# Patient Record
Sex: Female | Born: 1970 | Race: Black or African American | Hispanic: No | Marital: Married | Smoking: Never smoker
Health system: Southern US, Community
[De-identification: ages and names within clinical notes are randomized; demographics above are authoritative.]

## PROBLEM LIST (undated history)

## (undated) DIAGNOSIS — T7840XA Allergy, unspecified, initial encounter: Secondary | ICD-10-CM

## (undated) DIAGNOSIS — R011 Cardiac murmur, unspecified: Secondary | ICD-10-CM

## (undated) DIAGNOSIS — F32A Depression, unspecified: Secondary | ICD-10-CM

## (undated) DIAGNOSIS — I10 Essential (primary) hypertension: Secondary | ICD-10-CM

## (undated) DIAGNOSIS — R002 Palpitations: Secondary | ICD-10-CM

## (undated) DIAGNOSIS — E119 Type 2 diabetes mellitus without complications: Secondary | ICD-10-CM

## (undated) DIAGNOSIS — F419 Anxiety disorder, unspecified: Secondary | ICD-10-CM

## (undated) HISTORY — DX: Cardiac murmur, unspecified: R01.1

## (undated) HISTORY — DX: Anxiety disorder, unspecified: F41.9

## (undated) HISTORY — DX: Depression, unspecified: F32.A

## (undated) HISTORY — PX: TONSILLECTOMY: SUR1361

## (undated) HISTORY — DX: Palpitations: R00.2

## (undated) HISTORY — DX: Allergy, unspecified, initial encounter: T78.40XA

---

## 2000-02-16 ENCOUNTER — Emergency Department (HOSPITAL_COMMUNITY): Admission: EM | Admit: 2000-02-16 | Discharge: 2000-02-16 | Payer: Self-pay | Admitting: Emergency Medicine

## 2000-02-16 ENCOUNTER — Encounter: Payer: Self-pay | Admitting: Emergency Medicine

## 2003-02-17 ENCOUNTER — Emergency Department (HOSPITAL_COMMUNITY): Admission: EM | Admit: 2003-02-17 | Discharge: 2003-02-17 | Payer: Self-pay | Admitting: Emergency Medicine

## 2003-02-20 ENCOUNTER — Encounter: Admission: RE | Admit: 2003-02-20 | Discharge: 2003-02-20 | Payer: Self-pay | Admitting: Obstetrics and Gynecology

## 2003-02-22 ENCOUNTER — Inpatient Hospital Stay (HOSPITAL_COMMUNITY): Admission: AD | Admit: 2003-02-22 | Discharge: 2003-02-22 | Payer: Self-pay | Admitting: Obstetrics and Gynecology

## 2003-03-15 ENCOUNTER — Inpatient Hospital Stay (HOSPITAL_COMMUNITY): Admission: AD | Admit: 2003-03-15 | Discharge: 2003-03-17 | Payer: Self-pay | Admitting: Obstetrics and Gynecology

## 2003-03-18 ENCOUNTER — Inpatient Hospital Stay (HOSPITAL_COMMUNITY): Admission: AD | Admit: 2003-03-18 | Discharge: 2003-04-01 | Payer: Self-pay | Admitting: Obstetrics and Gynecology

## 2003-03-28 ENCOUNTER — Encounter (INDEPENDENT_AMBULATORY_CARE_PROVIDER_SITE_OTHER): Payer: Self-pay | Admitting: Specialist

## 2003-04-02 ENCOUNTER — Encounter: Admission: RE | Admit: 2003-04-02 | Discharge: 2003-05-02 | Payer: Self-pay | Admitting: Obstetrics and Gynecology

## 2003-05-03 ENCOUNTER — Encounter: Admission: RE | Admit: 2003-05-03 | Discharge: 2003-06-02 | Payer: Self-pay | Admitting: Obstetrics and Gynecology

## 2003-05-08 ENCOUNTER — Other Ambulatory Visit: Admission: RE | Admit: 2003-05-08 | Discharge: 2003-05-08 | Payer: Self-pay | Admitting: Obstetrics and Gynecology

## 2003-06-20 ENCOUNTER — Encounter: Admission: RE | Admit: 2003-06-20 | Discharge: 2003-07-20 | Payer: Self-pay | Admitting: Obstetrics and Gynecology

## 2003-07-31 ENCOUNTER — Encounter: Admission: RE | Admit: 2003-07-31 | Discharge: 2003-08-30 | Payer: Self-pay | Admitting: Obstetrics and Gynecology

## 2003-08-31 ENCOUNTER — Encounter: Admission: RE | Admit: 2003-08-31 | Discharge: 2003-09-30 | Payer: Self-pay | Admitting: Obstetrics and Gynecology

## 2004-10-18 IMAGING — US US FETAL BPP W/O NONSTRESS
1 series · 18 of 28 positions shown · non-contrast
Comparison: none

CLINICAL DATA: Chronic hypertension.  Assess fetal well-being and obstetrical doppler.  G1 P0.  LMP 08/05/02.

[Series 1: us ob detail +14 wk · 18 of 135 slices shown]
[im 1/135]
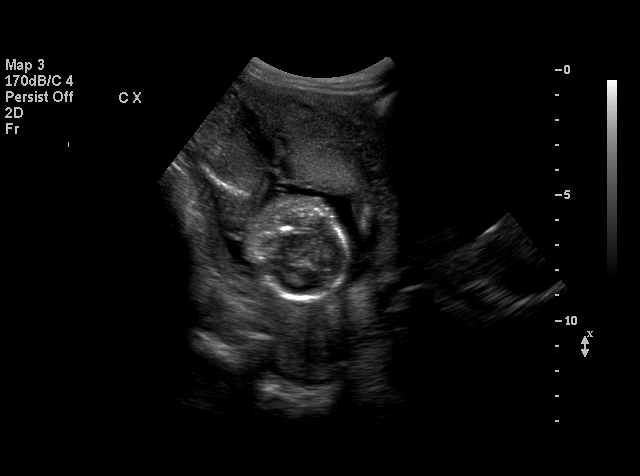
[im 10/135]
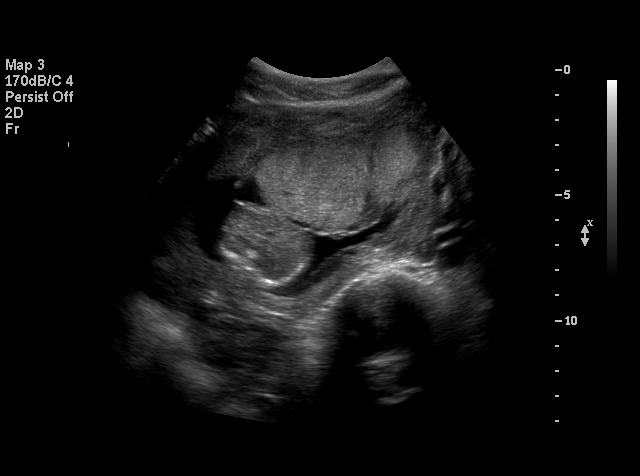
[im 15/135]
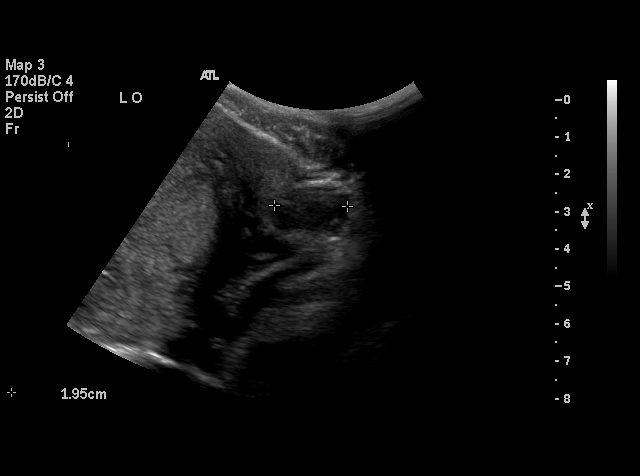
[im 25/135]
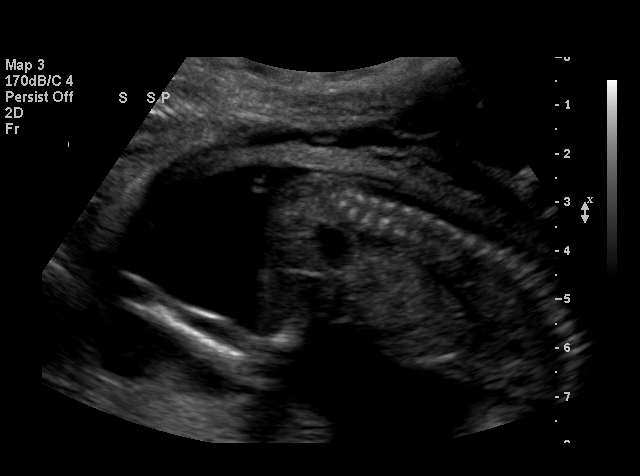
[im 35/135]
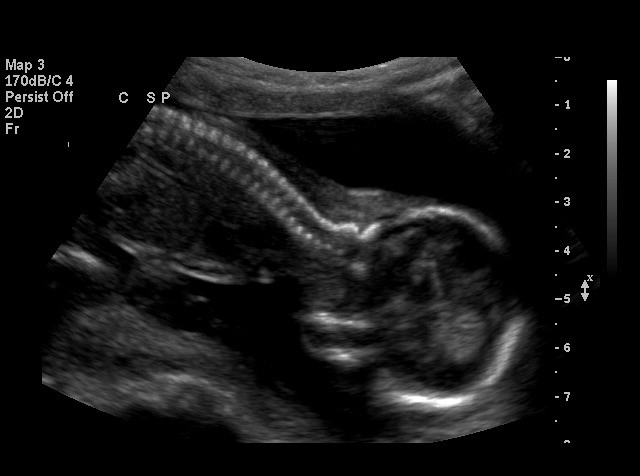
[im 40/135]
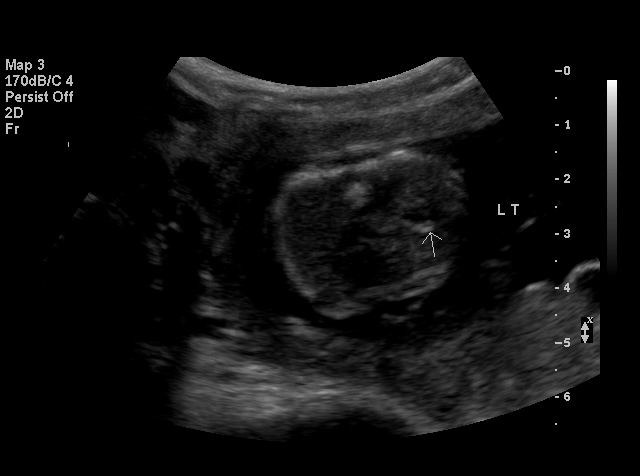
[im 50/135]
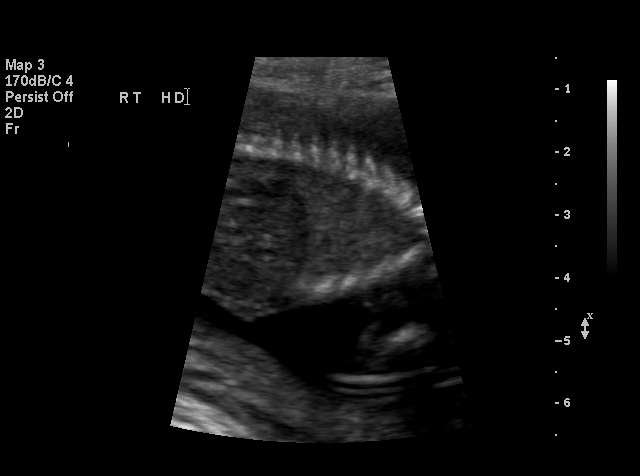
[im 55/135]
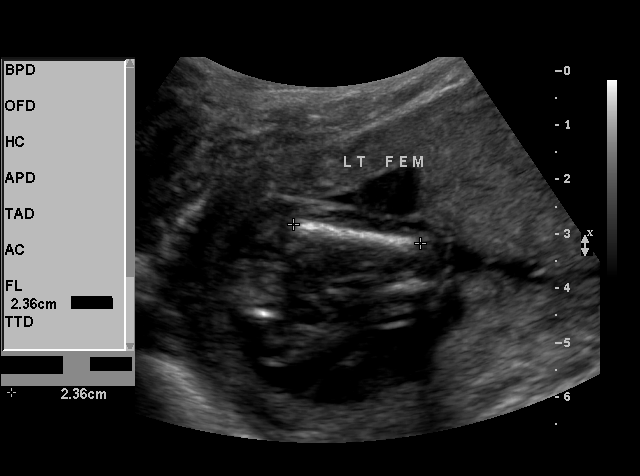
[im 65/135]
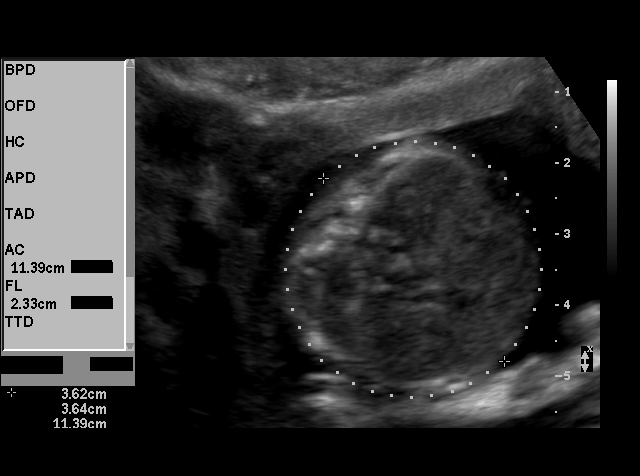
[im 70/135]
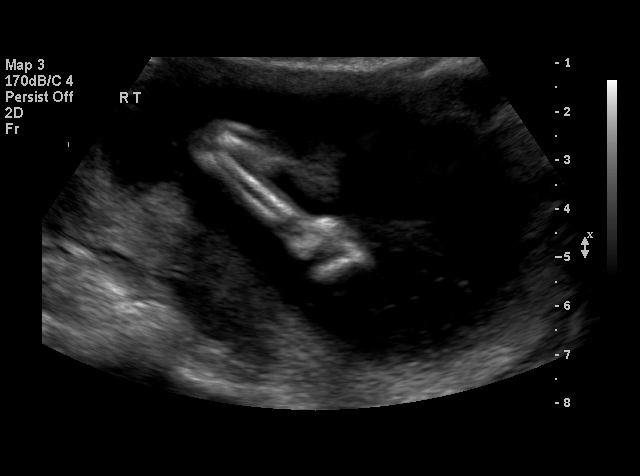
[im 80/135]
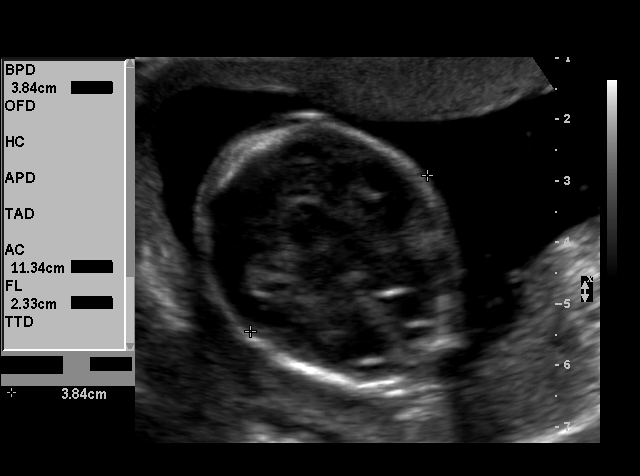
[im 85/135]
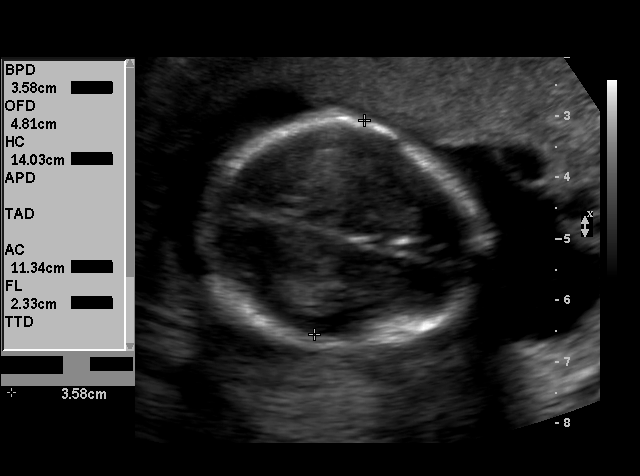
[im 95/135]
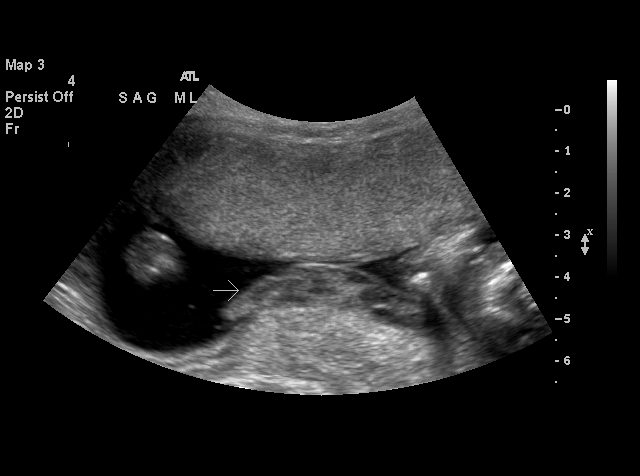
[im 105/135]
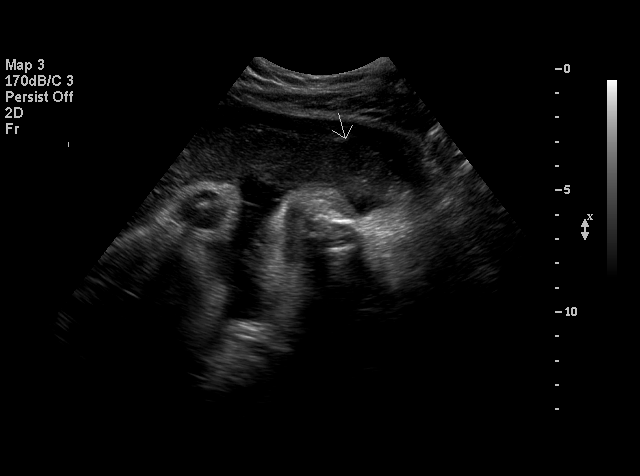
[im 110/135]
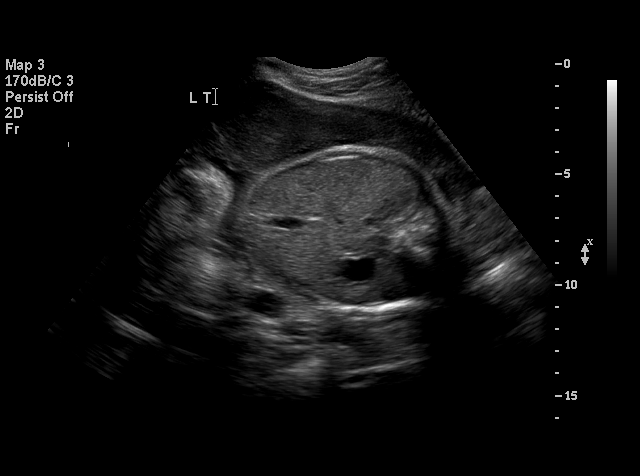
[im 120/135]
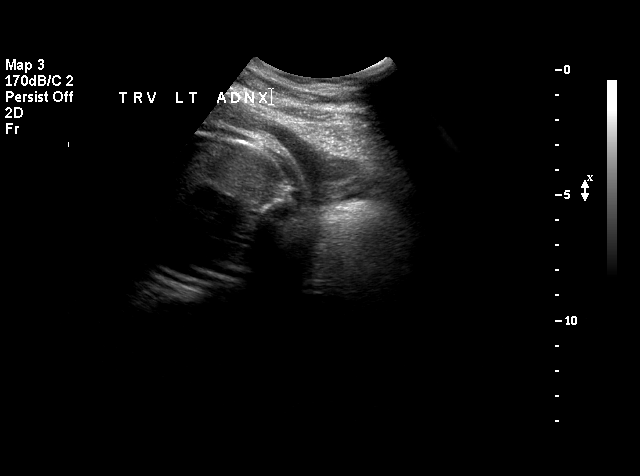
[im 125/135]
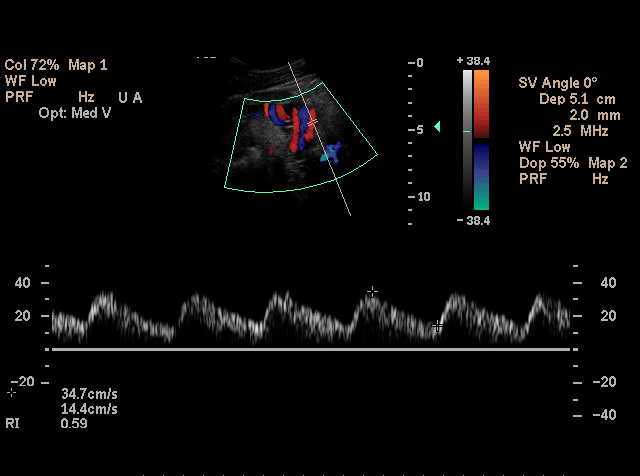
[im 135/135]
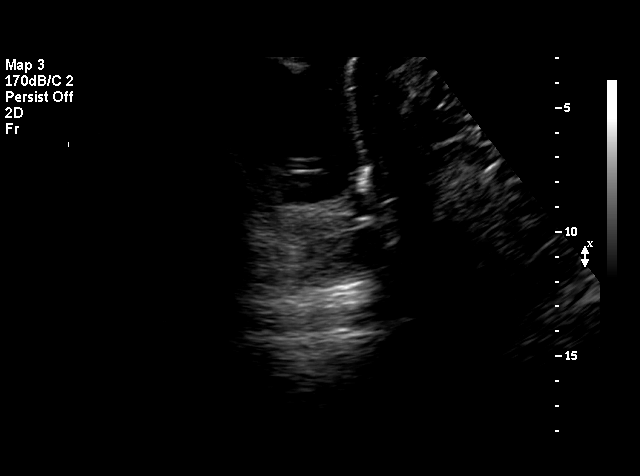

[18 of 28 positions shown; findings below may reference images not displayed]

BIOPHYSICAL PROFILE
NUMBER OF FETUSES:  1
HEART RATE:  150
MOVEMENT:  Yes
BREATHING:  Yes
PRESENTATION:  Cephalic
PLACENTAL LOCATION:  Anterior
GRADE:  I
PREVIA:  No
AMNIOTIC FLUID (SUBJECTIVE):  Decreased
AMNIOTIC FLUID (OBJECTIVE):  5.0 cm AFI (5th - 95th%ile =   8.3 ? 24.5 cm for 33 wks)

Fetal measurements and complete anatomic evaluation were not requested on this exam.  The following fetal anatomy was visualized today:  Lateral ventricles, four chamber heart, stomach, 3 vessel cord, kidneys, bladder, and diaphragm

BPP SCORING
MOVEMENTS:  2  Time:  15 minutes
BREATHING:  2
TONE:  2
AMNIOTIC FLUID:  2
TOTAL SCORE:  8

MATERNAL FINDINGS:
CERVIX:  3.1 cm Translabially

IMPRESSION
Single living intrauterine fetus in cephalic presentation.  Decreased amniotic fluid volume with AFI of 5 cm.
Biophysical profile score [DATE].
DOPPLER ULTRASOUND OF FETUS
Umbilical Artery S/D Ratio:  2.33  (NL < 3.70)

Middle Cerebral Artery PI:  1.48   (NL > 1.40)
IMPRESSION: Normal obstetrical doppler.

## 2004-10-20 IMAGING — US US FETAL BPP W/O NONSTRESS
1 series · 14 of 26 positions shown · non-contrast
Comparison: none

CLINICAL DATA: 32 week 6 day assigned gestational age.  Chronic hypertension. Evaluate biophysical profile and fetal dopplers.

[Series 1: unknown · 0.30mm/px · 14 of 26 slices shown]
[im 1/26]
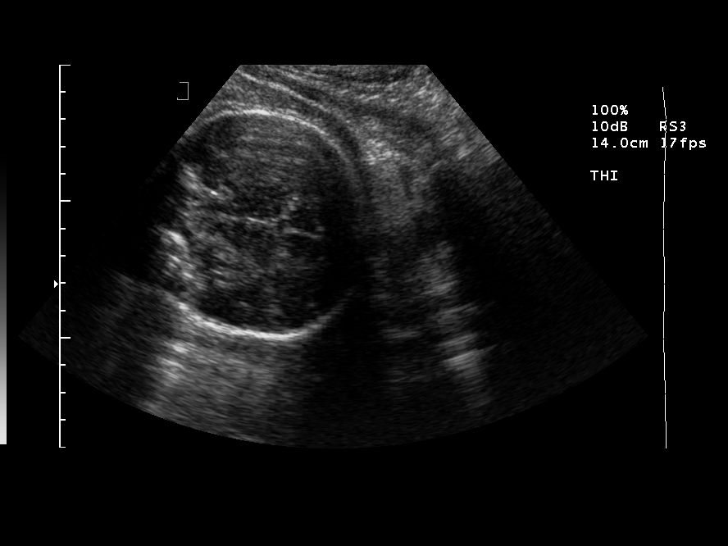
[im 3/26]
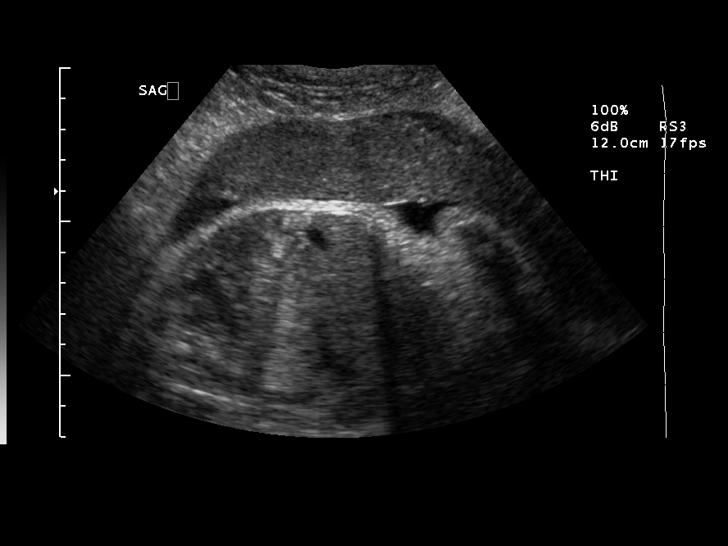
[im 5/26]
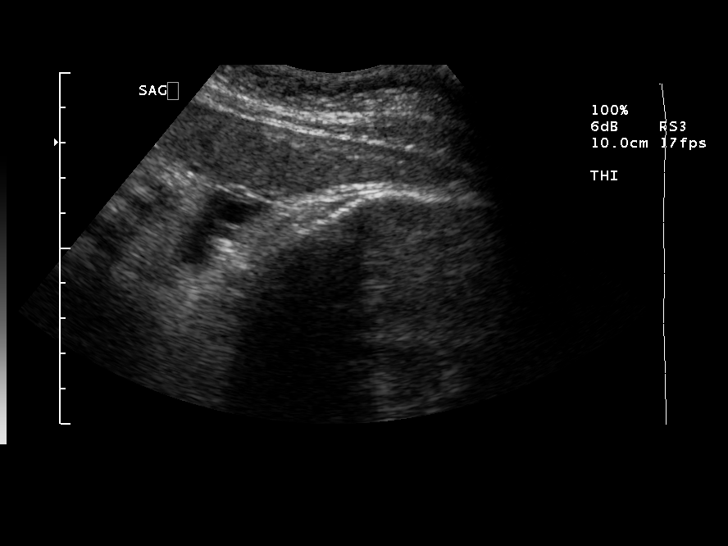
[im 7/26]
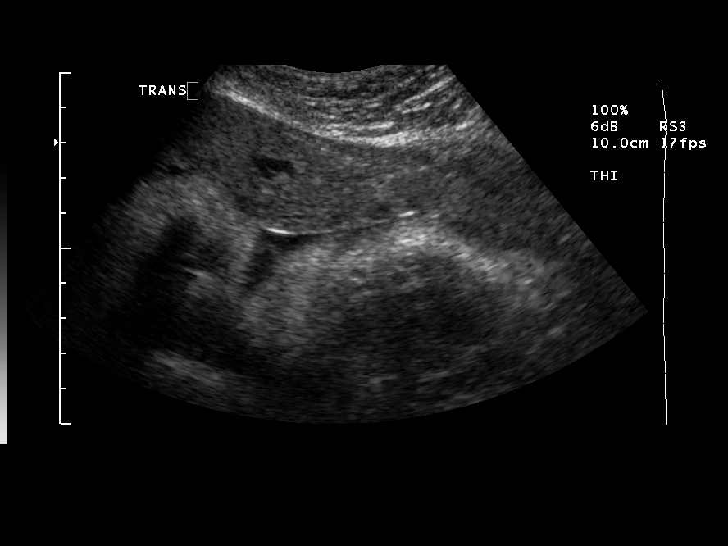
[im 9/26]
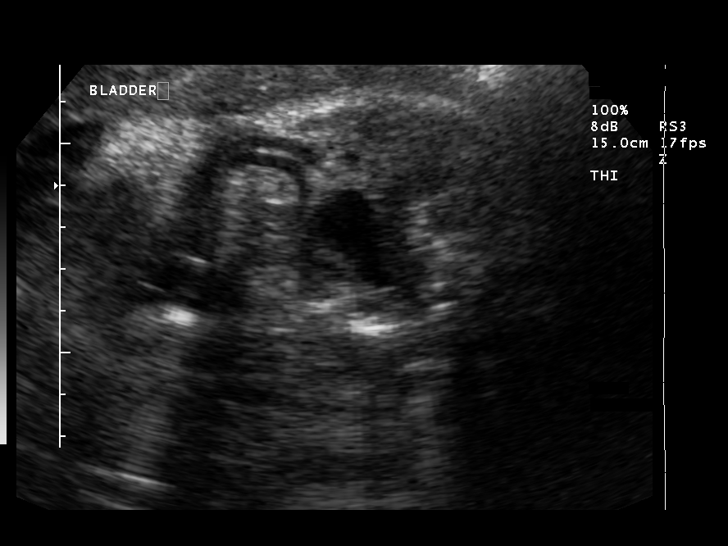
[im 11/26]
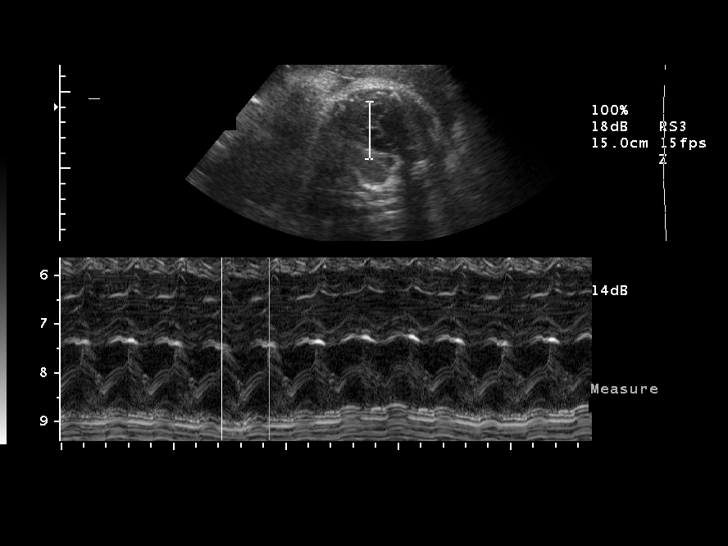
[im 13/26]
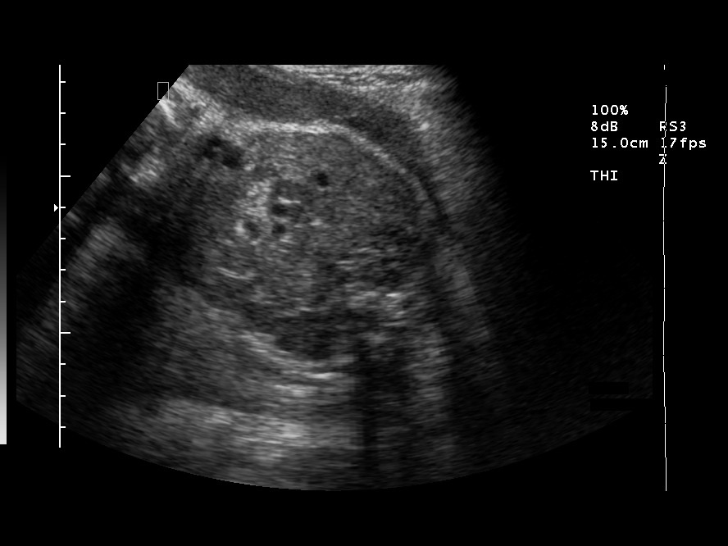
[im 14/26]
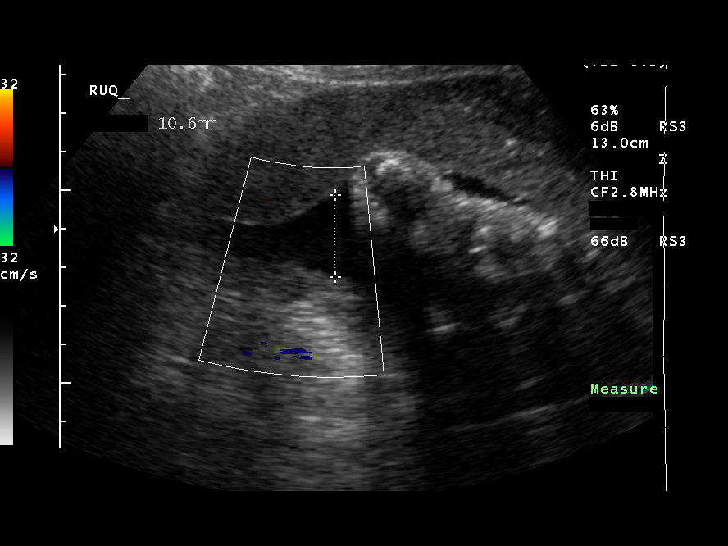
[im 16/26]
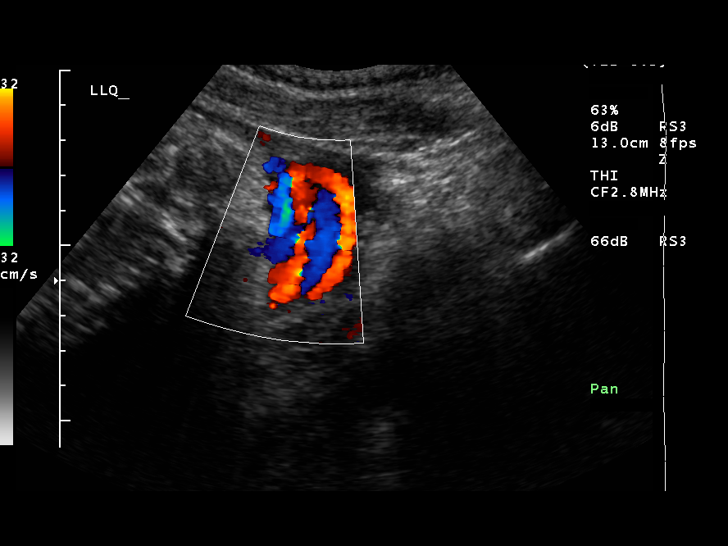
[im 18/26]
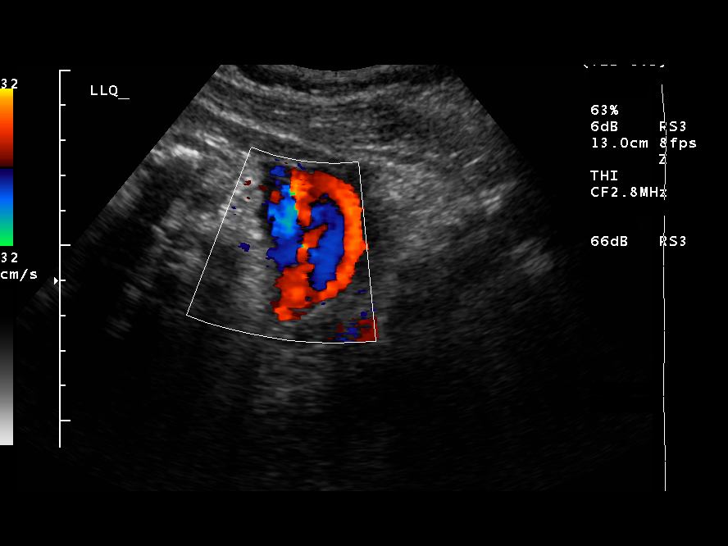
[im 20/26]
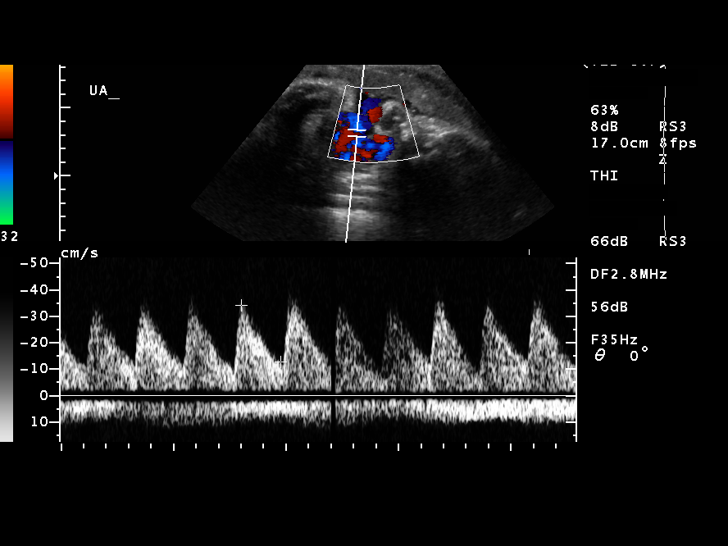
[im 22/26]
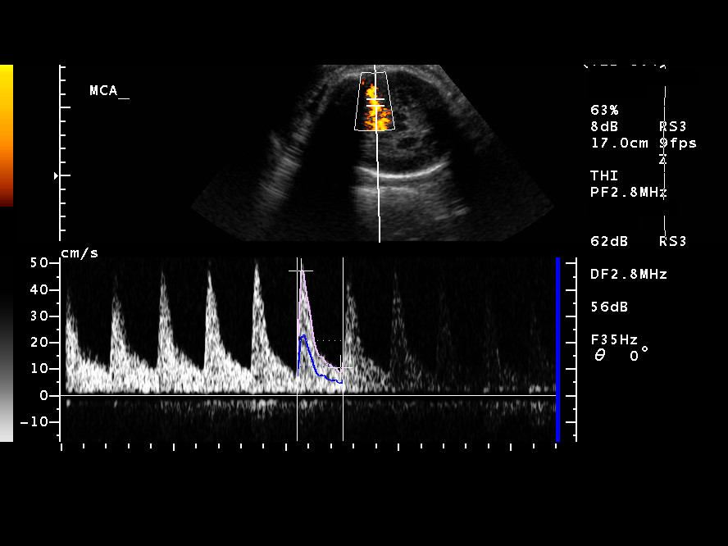
[im 24/26]
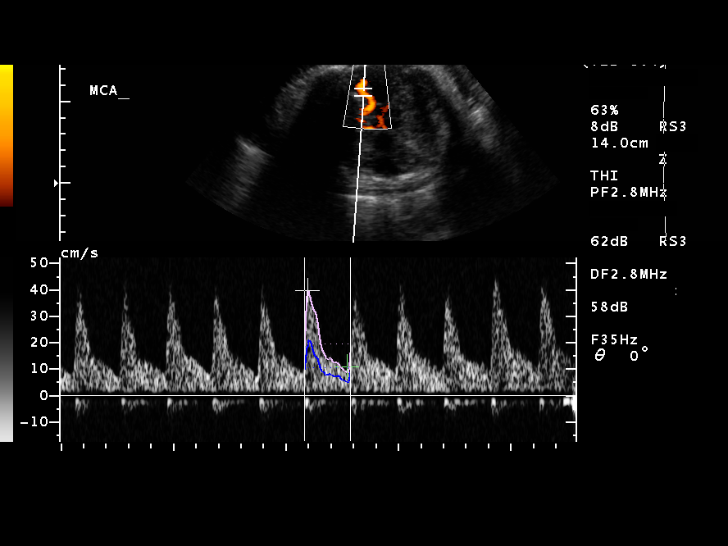
[im 26/26]
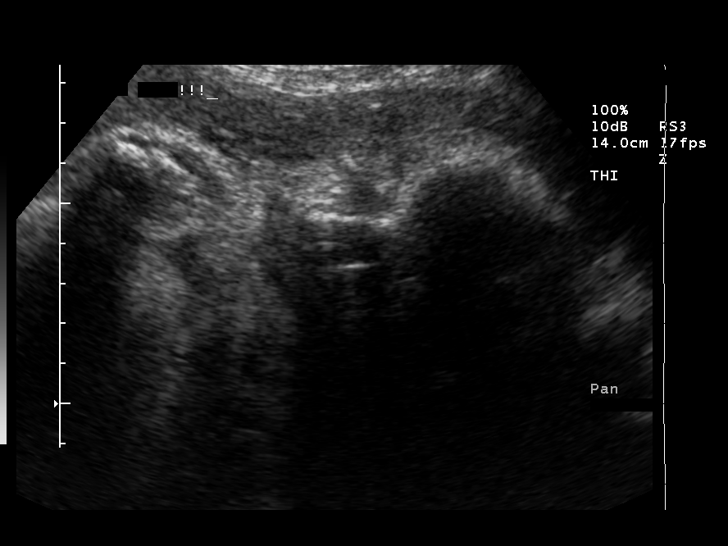

[14 of 26 positions shown; findings below may reference images not displayed]

BIOPHYSICAL PROFILE
NUMBER OF FETUSES:  1
HEART RATE:  142
MOVEMENT:  Yes
BREATHING:  Yes
PRESENTATION:  Cephalic
PLACENTAL LOCATION:  Anterior
GRADE:  II
PREVIA:  No
AMNIOTIC FLUID (SUBJECTIVE):  Decreased
AMNIOTIC FLUID (OBJECTIVE):  7.1 cm AFI (5th -95th%ile =   8.3 ? 24.5 cm for 33 wks)

Fetal measurements and complete anatomic evaluation were not requested on this exam.  The following fetal anatomy was visualized today:  Lateral ventricles, stomach, kidneys, bladder and diaphragm.

BPP SCORING
MOVEMENTS:  2  Time:  15 minutes
BREATHING:  2
TONE:  2
AMNIOTIC FLUID:  2
TOTAL SCORE:  8

MATERNAL FINDINGS:
CERVIX:  Not evaluated; 3.1 cm on 03/21/03.

IMPRESSION
Single living intrauterine fetus in cephalic presentation.  Amniotic fluid volume remains subjectively decreased, with AFI of 7.1 cm.
Biophysical profile score [DATE].

DOPPLER ULTRASOUND OF FETUS
Umbilical Artery S/D Ratio:  2.60  (NL <3.70)

Middle Cerebral Artery PI:  1.60   (NL > 1.40)
IMPRESSION: Normal fetal doppler measurements.

## 2004-10-23 IMAGING — US US UA DOPPLER RE-EVAL
1 series · 14 of 28 positions shown · non-contrast
Comparison: none

CLINICAL DATA: Preeclampsia.  Assess fetal well-being.

[Series 1: unknown · 0.30mm/px · 14 of 30 slices shown]
[im 2/30]
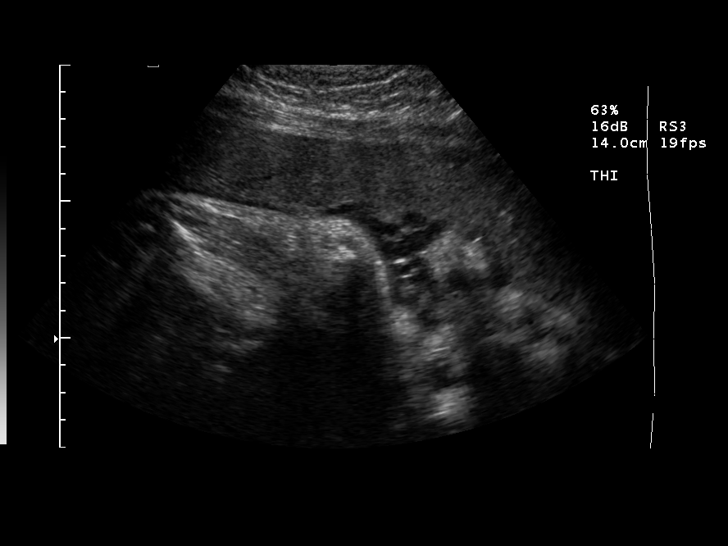
[im 4/30]
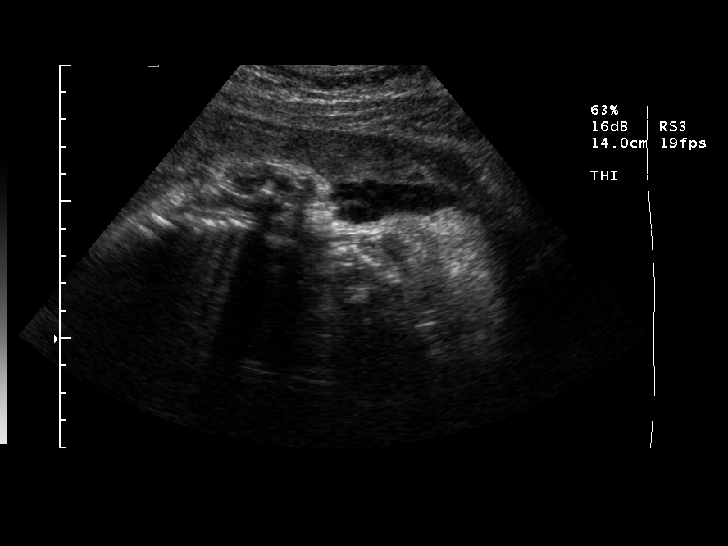
[im 6/30]
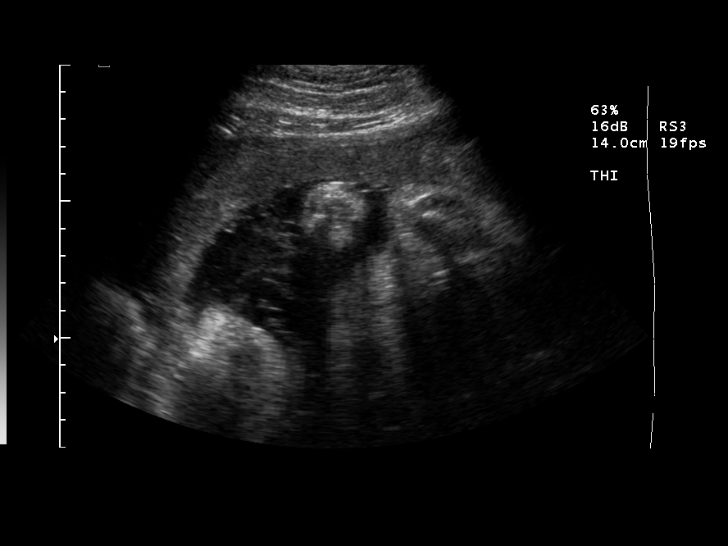
[im 8/30]
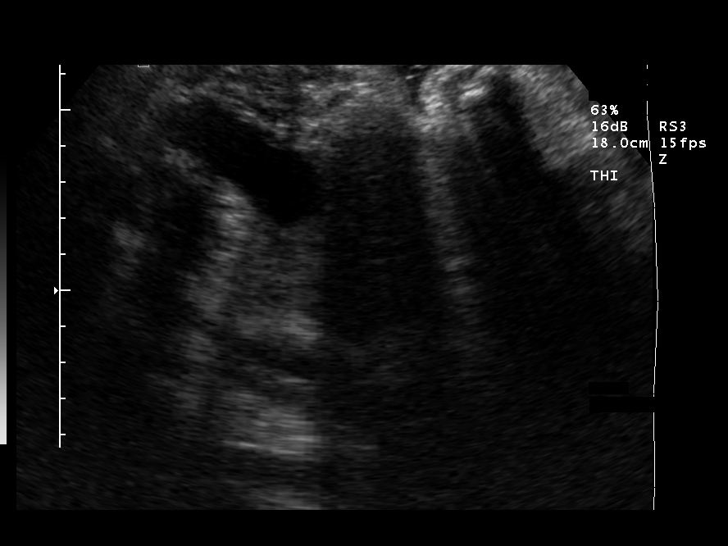
[im 10/30]
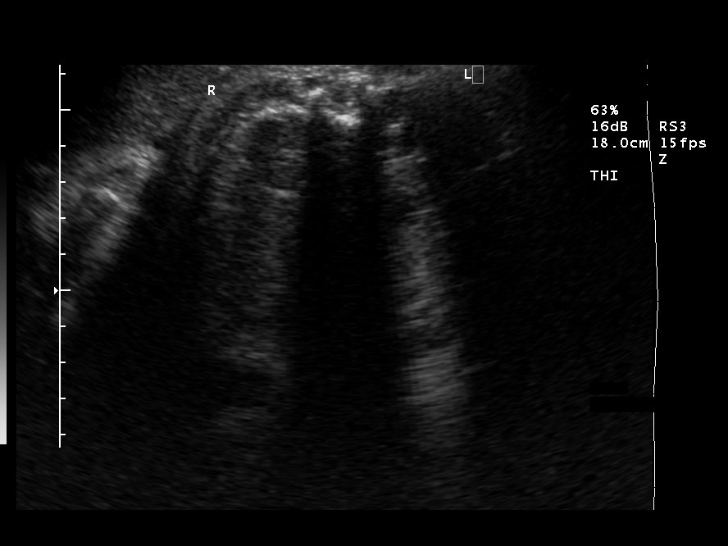
[im 12/30]
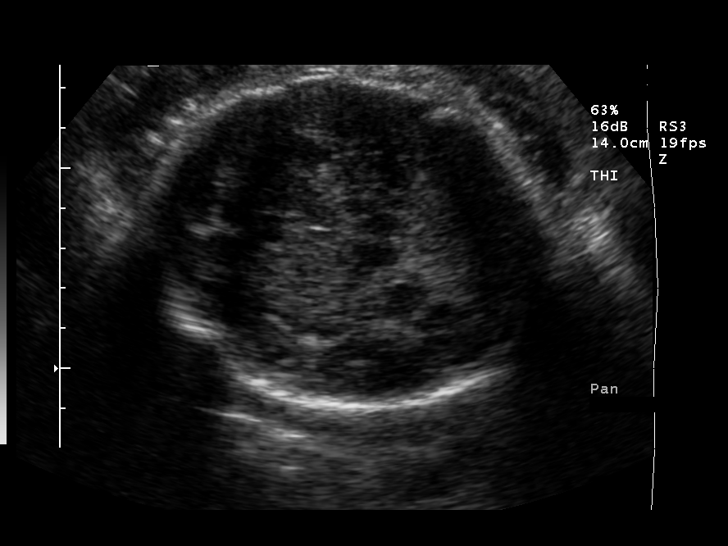
[im 14/30]
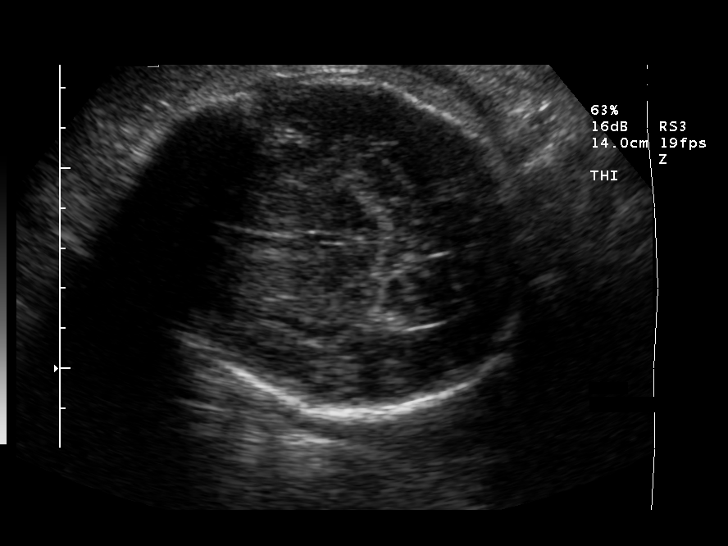
[im 17/30]
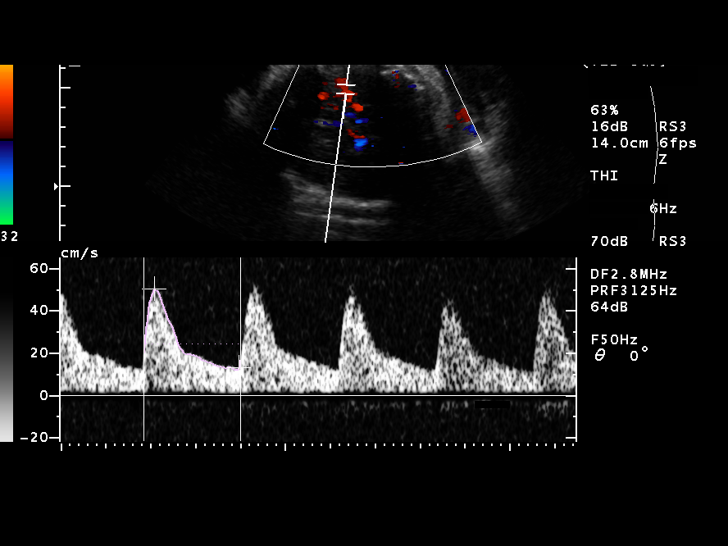
[im 19/30]
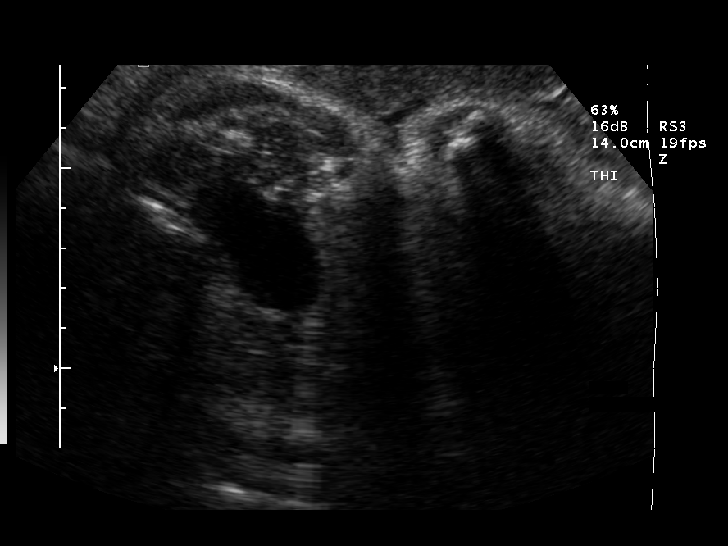
[im 21/30]
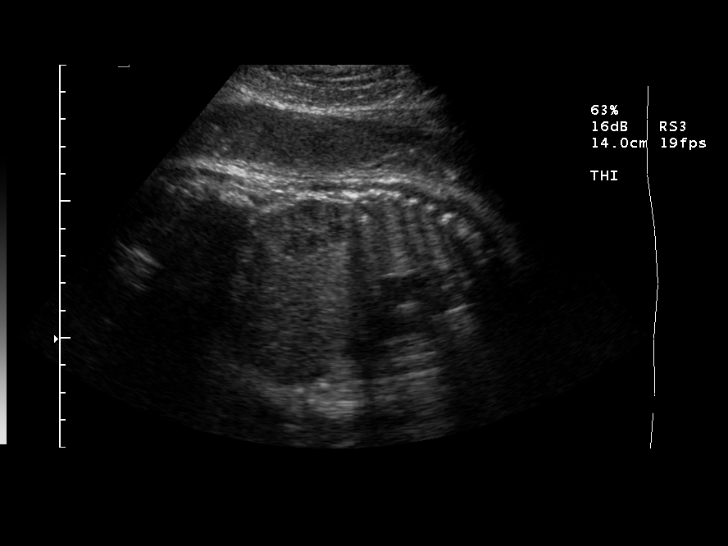
[im 23/30]
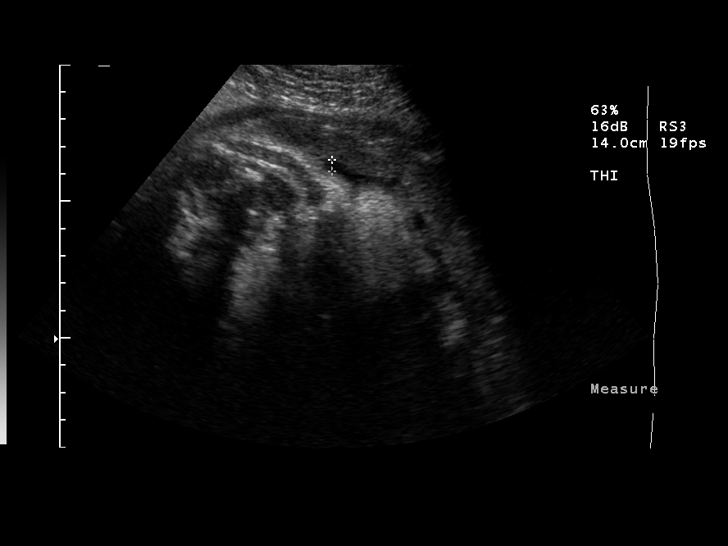
[im 25/30]
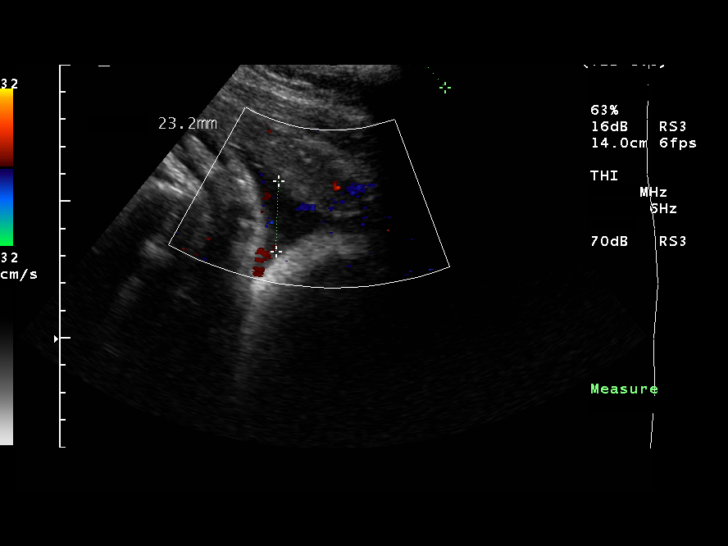
[im 27/30]
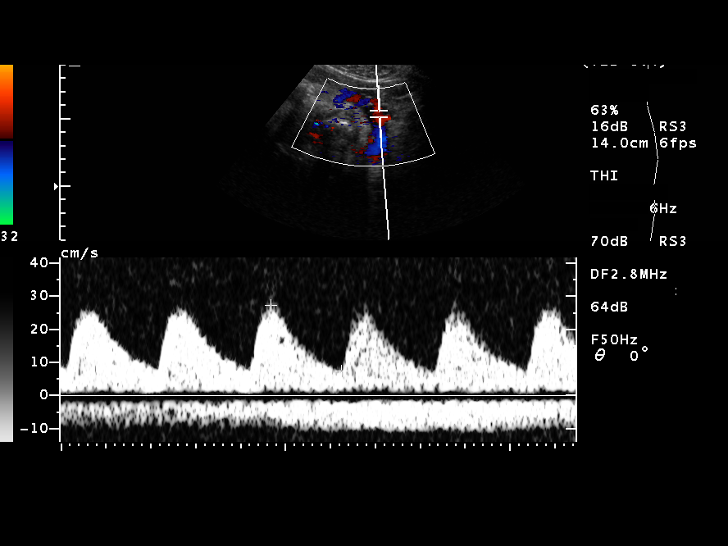
[im 30/30]
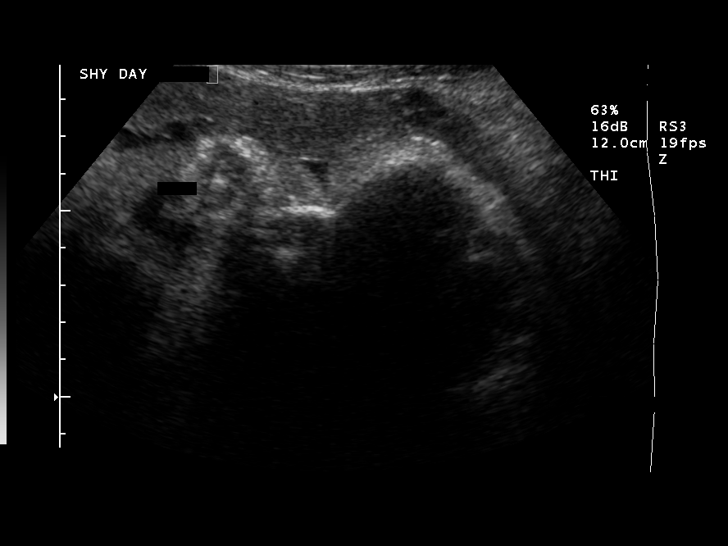

[14 of 28 positions shown; findings below may reference images not displayed]

LIMITED OBSTETRICAL ULTRASOUND
Number of Fetuses: 1
Heart Rate:  140
Movement:  Yes
Breathing:  Yes
Presentation:  Cephalic
Placental Location:  Anterior
Grade:  II
Previa:  No
Amniotic Fluid (Subjective):  Low normal  
Amniotic Fluid (Objective):  9.0 cm AFI (5th -95th%ile =   8.3 ? 24.5 cm for 33 wks)

Fetal measurements and complete anatomic evaluation were not requested.  The following fetal anatomy was visualized during this exam:  Lateral ventricles, thalami, stomach, kidneys, bladder and diaphragm

MATERNAL FINDINGS:
Cervix:  Not evaluated

DOPPLER ULTRASOUND OF FETUS
Umbilical Artery S/D Ratio:  3.77  (NL < 3.70)

Middle Cerebral Artery PI:  1.56  (NL > 1.40)
IMPRESSION: Slightly elevated umbilical artery doppler with a normal middle cerebral artery doppler assessment.  No absence or reversal of end diastolic flow is apparent.
Subjectively and quantitatively low normal amniotic fluid volume.

## 2005-01-12 ENCOUNTER — Other Ambulatory Visit: Admission: RE | Admit: 2005-01-12 | Discharge: 2005-01-12 | Payer: Self-pay | Admitting: Obstetrics and Gynecology

## 2005-01-22 ENCOUNTER — Emergency Department (HOSPITAL_COMMUNITY): Admission: EM | Admit: 2005-01-22 | Discharge: 2005-01-22 | Payer: Self-pay | Admitting: Emergency Medicine

## 2005-05-18 ENCOUNTER — Emergency Department (HOSPITAL_COMMUNITY): Admission: EM | Admit: 2005-05-18 | Discharge: 2005-05-18 | Payer: Self-pay | Admitting: Family Medicine

## 2005-08-04 ENCOUNTER — Encounter: Admission: RE | Admit: 2005-08-04 | Discharge: 2005-08-04 | Payer: Self-pay | Admitting: Obstetrics and Gynecology

## 2005-11-03 ENCOUNTER — Inpatient Hospital Stay (HOSPITAL_COMMUNITY): Admission: AD | Admit: 2005-11-03 | Discharge: 2005-11-03 | Payer: Self-pay | Admitting: Obstetrics and Gynecology

## 2005-11-20 ENCOUNTER — Ambulatory Visit: Payer: Self-pay | Admitting: Gynecology

## 2005-11-20 ENCOUNTER — Inpatient Hospital Stay (HOSPITAL_COMMUNITY): Admission: AD | Admit: 2005-11-20 | Discharge: 2005-12-07 | Payer: Self-pay | Admitting: Obstetrics and Gynecology

## 2005-12-03 ENCOUNTER — Encounter (INDEPENDENT_AMBULATORY_CARE_PROVIDER_SITE_OTHER): Payer: Self-pay | Admitting: Specialist

## 2005-12-03 ENCOUNTER — Ambulatory Visit: Payer: Self-pay | Admitting: Neonatology

## 2005-12-08 ENCOUNTER — Encounter: Admission: RE | Admit: 2005-12-08 | Discharge: 2006-01-06 | Payer: Self-pay | Admitting: Obstetrics and Gynecology

## 2006-01-07 ENCOUNTER — Encounter: Admission: RE | Admit: 2006-01-07 | Discharge: 2006-02-06 | Payer: Self-pay | Admitting: Obstetrics and Gynecology

## 2006-02-07 ENCOUNTER — Encounter: Admission: RE | Admit: 2006-02-07 | Discharge: 2006-03-08 | Payer: Self-pay | Admitting: Obstetrics and Gynecology

## 2006-03-09 ENCOUNTER — Encounter: Admission: RE | Admit: 2006-03-09 | Discharge: 2006-04-08 | Payer: Self-pay | Admitting: Obstetrics and Gynecology

## 2006-04-09 ENCOUNTER — Encounter: Admission: RE | Admit: 2006-04-09 | Discharge: 2006-05-09 | Payer: Self-pay | Admitting: Obstetrics and Gynecology

## 2006-05-10 ENCOUNTER — Encounter: Admission: RE | Admit: 2006-05-10 | Discharge: 2006-06-07 | Payer: Self-pay | Admitting: Obstetrics and Gynecology

## 2006-06-08 ENCOUNTER — Encounter: Admission: RE | Admit: 2006-06-08 | Discharge: 2006-07-08 | Payer: Self-pay | Admitting: Obstetrics and Gynecology

## 2006-07-09 ENCOUNTER — Encounter: Admission: RE | Admit: 2006-07-09 | Discharge: 2006-08-07 | Payer: Self-pay | Admitting: Obstetrics and Gynecology

## 2006-08-08 ENCOUNTER — Encounter: Admission: RE | Admit: 2006-08-08 | Discharge: 2006-09-07 | Payer: Self-pay | Admitting: Obstetrics and Gynecology

## 2006-09-08 ENCOUNTER — Encounter: Admission: RE | Admit: 2006-09-08 | Discharge: 2006-10-07 | Payer: Self-pay | Admitting: Obstetrics and Gynecology

## 2006-10-08 ENCOUNTER — Encounter: Admission: RE | Admit: 2006-10-08 | Discharge: 2006-11-07 | Payer: Self-pay | Admitting: Obstetrics and Gynecology

## 2006-11-08 ENCOUNTER — Encounter: Admission: RE | Admit: 2006-11-08 | Discharge: 2006-12-08 | Payer: Self-pay | Admitting: Obstetrics and Gynecology

## 2006-11-17 ENCOUNTER — Emergency Department (HOSPITAL_COMMUNITY): Admission: EM | Admit: 2006-11-17 | Discharge: 2006-11-17 | Payer: Self-pay | Admitting: Family Medicine

## 2006-12-03 ENCOUNTER — Encounter: Admission: RE | Admit: 2006-12-03 | Discharge: 2007-02-23 | Payer: Self-pay | Admitting: Obstetrics and Gynecology

## 2006-12-09 ENCOUNTER — Encounter: Admission: RE | Admit: 2006-12-09 | Discharge: 2007-01-07 | Payer: Self-pay | Admitting: Obstetrics and Gynecology

## 2007-01-08 ENCOUNTER — Encounter: Admission: RE | Admit: 2007-01-08 | Discharge: 2007-02-07 | Payer: Self-pay | Admitting: Obstetrics and Gynecology

## 2007-02-08 ENCOUNTER — Encounter: Admission: RE | Admit: 2007-02-08 | Discharge: 2007-03-09 | Payer: Self-pay | Admitting: Obstetrics and Gynecology

## 2007-03-10 ENCOUNTER — Encounter: Admission: RE | Admit: 2007-03-10 | Discharge: 2007-04-09 | Payer: Self-pay | Admitting: Obstetrics and Gynecology

## 2007-04-10 ENCOUNTER — Encounter: Admission: RE | Admit: 2007-04-10 | Discharge: 2007-05-02 | Payer: Self-pay | Admitting: Obstetrics and Gynecology

## 2010-08-01 NOTE — Discharge Summary (Signed)
Tara [name], LACHMAN              ACCOUNT NO.:  [id number]   MEDICAL RECORD NO.:  [id number]          PATIENT TYPE:  INP   LOCATION:  9302                          FACILITY:  [facility]   PHYSICIAN:  Guy Sandifer. Henderson Cloud, M.D. DATE OF BIRTH:  1970/08/05   DATE OF ADMISSION:  11/20/2005  DATE OF DISCHARGE:  12/07/2005                                 DISCHARGE SUMMARY   ADMITTING DIAGNOSES:  1. Intrauterine pregnancy at 8 weeks estimated gestational age.  2. Chronic hypertension.  3. Fetal heart rate decelerations.   DISCHARGE DIAGNOSES:  1. Intrauterine pregnancy at 28-and-a-half weeks estimated additional age,      delivered.  2. Preeclampsia.   PROCEDURE:  On December 03, 2005, classical cesarean section.   REASON FOR ADMISSION:  This patient is a 40 year old married African-  American female G2, P1 at 26 weeks by dates.  She presents to the hospital  complaining of elevated blood pressures and headache.  She was on Aldomet  for chronic hypertension.  Blood pressure in the hospital was noted to be  160/110.  It came down slightly with rest.  Fetal heart rate had minimal  variability on external monitors.  There was one prolonged deceleration.  Biophysical profile was 6/8.  Cervix was 3 cm in length.  Fetal growth was  within normal limits.  She was admitted for observation.   HOSPITAL COURSE:  The patient was initially continued on oral Aldomet.  She  required Apresoline for acute control of her blood pressure on November 22, 2005.  Laboratories on admission were essentially within normal limits.  The  patient was followed with serial vital signs and laboratory studies.  Urinalysis on admission was negative for protein.  A 24-hour urine completed  on November 22, 2005, revealed 540 mg of protein in 24 hours.  On November 22, 2005, Aldomet was discontinued and she was started on Procardia.  Maternal fetal medicine consultation was obtained on November 22, 2005.  Per  recommendations, Procardia was discontinued and the patient was started on  Norvasc 10 mg daily.  Fetal surveillance was continued with Doppler studies  two times a week, as well as fetal heart rate monitoring and serial  ultrasounds.  She was given a course of betamethasone on November 23, 2005.  She was noted to have an elevated glucose.  However, it was shortly after  her betamethasone.  On November 25, 2005, labetalol was added to her  regimen per maternal fetal medicine recommendations.  By November 26, 2005,  she was started on an ADA diet with serial blood sugars for possible  gestational diabetes.  Doppler studies continued to be within normal limits.  Repeat 24-hour urine on September 17 revealed 373 mg of protein in 24 hours.  Glyburide was instituted on December 01, 2005.  Occasional intermittent  variable decelerations had been noted.  By the morning of December 03, 2005, they were occurring more frequently.  Doppler studies on the morning  of December 03, 2005, were abnormal both in the middle cerebral artery and  in the umbilical artery.  Over the course of  the morning the variable  decelerations became more frequent.  Biophysical profile obtained was 4/8.  Along with maternal fetal medicine consultation, cesarean section was  recommended.  Consultation for the patient with neonatal medicine was also  obtained on the morning of December 03, 2005.  On December 03, 2005, she  was taken to operating room and underwent a classical cesarean section.  This was productive of a viable female infant, Apgars of 6 and 7 at one and  five minutes respectively.  Postoperatively, the patient was continued on  Norvasc.  The labetalol was initially held to further evaluate blood  pressure management in the postoperative state.  She did have an elevation  of her liver functions with an AST of 60 and an ALT of 62 on her first  postoperative day.  By September 22 the AST was normal at 37  and the ALT was  marginally elevated at 42.  On September 23 blood pressures were somewhat  elevated.  The Norvasc was increased to 20 mg daily.  On the day of  discharge she is ambulating and voiding well.  Blood pressures are 130s to  150s over 80s to 90s.  She did have a single blood pressure of 146/111 on  the evening prior to discharge.  Her incision is healing well.   CONDITION ON DISCHARGE:  Good.   DIET:  Regular as tolerated.   ACTIVITY:  No lifting, no operation of automobiles, no vaginal entry.  She  is to call the office for problems including but not limited to increasing  pain, severe headache or blurry vision, temperature of 101 degrees, heavy  vaginal bleeding, or persistent nausea or vomiting.   MEDICATIONS:  1. Norvasc 20 mg daily, #30 one p.o. daily, six refills.  2. Percocet 5/325 mg #30 one to two p.o. q.6h. p.r.n.  3. Ibuprofen 600 mg q.6h. p.r.n.  4. Prenatal vitamin daily.   Instructions are reviewed.  Followup is in the office in 2-3 days for a  blood pressure check.      Guy Sandifer Henderson Cloud, M.D.  Electronically Signed     JET/MEDQ  D:  12/07/2005  T:  12/08/2005  Job:  [id number]

## 2010-08-01 NOTE — H&P (Signed)
Tara [name], [name]              ACCOUNT NO.:  [id number]   MEDICAL RECORD NO.:  [id number]          PATIENT TYPE:  MAT   LOCATION:  [facility]                          FACILITY:  [facility]   PHYSICIAN:  Juluis Mire, M.D.   DATE OF BIRTH:  [birth year]   DATE OF ADMISSION:  11/20/2005  DATE OF DISCHARGE:                                HISTORY & PHYSICAL   The patient is a 40 year old gravida 2, para 1, female at 85 weeks by dates.  She presents complaining of higher blood pressure and headache.  She is  chronically hypertensive, on Aldomet.  Her blood pressures have been  elevated in the office.  Her last pregnancy was complicated by evidently  severe pregnancy-induced hypertension, resulting in a delivery at 32 weeks.  In the hospital it was noted her blood pressures were 160/110.  They did  come down slightly with rest.  Monitoring:  The fetal heart rate had minimal  variability.  There was one prolonged deceleration.  Now the fetal heart  rate is in the 140s, still minimal variability.  She underwent a biophysical  profile, which was 6/8.  She did have normal amniotic fluid, and growth  parameters were normal.  Cervical length was 3.  Because of the blood  pressure, headaches, and the fetal heart rate, we are going to monitor,  observe her overnight.   ALLERGIES:  She has no known drug allergies.   MEDICATIONS:  Aldomet, prenatal vitamins.   For past medical history, family and social history, please see prenatal  records.   REVIEW OF SYSTEMS:  Noncontributory.   PHYSICAL EXAMINATION:  VITAL SIGNS:  Blood pressure 160/110.  Other vital  signs were stable.  LUNGS:  Clear.  CARDIAC:  Regular rhythm and rate.  There are no murmurs or gallops.  ABDOMEN:  Gravid uterus consistent with dates.  PELVIC:  Cervix not examined.  EXTREMITIES:  1+ edema.  NEUROLOGIC:  Deep tendon reflexes 1+.  There is no clonus.   LABORATORY DATA:  PIH labs were basically within normal limits.    IMPRESSION:  1. Intrauterine pregnancy at 26 weeks with chronic hypertension.  2. Fetal heart rate with deceleration and minimal variability but normal      biophysical profile.   PLAN:  We will go ahead and observe her, watch the fetal heart rate, and  assess her blood pressure as we keep her on bed rest.  If she has a decent  response, will probably send her home and continue bed rest.  Will come out  of work and continue on her blood pressure pills.  If fails to respond, may  need to consider switching to alternative antihypertensives.      Juluis Mire, M.D.  Electronically Signed     JSM/MEDQ  D:  11/20/2005  T:  11/20/2005  Job:  [id number]

## 2010-08-01 NOTE — Op Note (Signed)
Tara [name], Tara [name]              ACCOUNT NO.:  [id number]   MEDICAL RECORD NO.:  [id number]          PATIENT TYPE:  INP   LOCATION:  9372                          FACILITY:  [facility]   PHYSICIAN:  Guy Sandifer. Henderson Cloud, M.D. DATE OF BIRTH:  08-29-1970   DATE OF PROCEDURE:  12/03/2005  DATE OF DISCHARGE:                                 OPERATIVE REPORT   PREOPERATIVE DIAGNOSIS:  1. Intrauterine pregnancy at 28-1/7 weeks estimated additional age.  2. Preeclampsia.  3. Nonreassuring fetal heart tracing.   POSTOPERATIVE DIAGNOSIS:  1. Intrauterine pregnancy at 28-1/7 weeks estimated additional age.  2. Preeclampsia.  3. Nonreassuring fetal heart tracing.   PROCEDURE:  Cesarean section with vertical (classical) incision.   SURGEON:  Guy Sandifer. Henderson Cloud, M.D.   ASSISTANT:  Ginger Carne, M.D.   ANESTHESIA:  Spinal.   SPECIMENS:  Placenta to pathology.   ESTIMATED BLOOD LOSS:  900 mL.   FINDINGS:  Viable female infant, Apgars of 6 and 7 at 1 and 5 minutes  respectively.  Birth weight and arterial cord pH pending.   INDICATIONS AND CONSENT:  This patient is a 40 year old G2, P1 with a  history of severe preeclampsia at 32 weeks.  She is admitted at 40 weeks  estimated gestational age for observation evaluation.  She has been followed  while on Norvasc and labetalol orally.  The baby has had intermittent  variable decelerations but to this date, Doppler studies and biophysical  profiles have been reassuring.  Over the course of last 12 to 18 hours,  variable decelerations were again noted.  Ultrasound today revealed  estimated fetal weight of 1063 grams, 50th to 75th percentile.  The Doppler  studies were abnormal with an elevated umbilical artery of 5.8 and a low  middle cerebral artery of 1.4.  Consultation with internal fetal medicine  was undertaken.  The patient then underwent biophysical profile at the  bedside which was only 4/8.  In addition there were more frequent variable  decelerations noted on the monitor.  After consultation with maternal fetal  medicine, delivery is recommended.  Cesarean section is discussed with the  patient and her husband.  The potential risks and complications were  discussed preoperatively including but limited to infection, bowel, bladder,  ureteral damage, bleeding requiring transfusion of blood products and  possible transfusion reaction, HIV and hepatitis acquisition, DVT, PE,  pneumonia.  Possible vertical incision on the uterus was also discussed  which would be an indication for repeat C-section if she had any further  pregnancies.  The patient required about tubal ligation.  This was discussed  with the patient and her husband.  In view of the prematurity of the  situation, the patient elects to hold tubal ligation at this point.  All  questions were answered.   PROCEDURE:  The patient is taken to the operating room where she is  identified, spinal anesthetic is placed, and she is placed in the dorsal  supine position with a 15 degrees left lateral wedge.  She is then prepped,  Foley catheter is placed, and the bladder is drained, she  is draped in a  sterile fashion.  After testing for adequate spinal anesthesia, the skin is  entered through a Pfannenstiel incision and dissection is carried out  layers.  The peritoneum is incised and extended superiorly and inferiorly.  The lower uterine segment is narrow.  The vesicouterine peritoneum was taken  down and the bladder flap was developed.  A vertical incision on the uterus  was then made with a knife.  The uterine cavity is entered bluntly with a  Kelly clamp.  The incision was extended inferiorly and superiorly with the  bandage scissors.  Artificial rupture of membranes was then carried out and  clear fluid is noted.  The vertex is delivered.  Nuchal cord x2 was noted  and reduced.  The cord is clamped and cut and the baby was handed to the  awaiting pediatrics team.  The  placenta was manually delivered and sent to  pathology.  The uterus was then exteriorized.  The uterine cavity is  cleaned.  The uterus is closed in three running locking imbricating layers  of 0 Monocryl suture.  Figure-of-eight 2-0 chromic sutures were used to  obtain complete hemostasis on the incision.  There are multiple small less  than 1 cm subserosal leiomyomata on the posterior wall of the uterus.  On  the left fundus, there is a subserosal 3 cm and another 2 cm fibroid noted  close to the insertion of the left fallopian tube.  The tubes were normal  bilaterally.  The uterus was returned to the abdominal cavity.  Copious  irrigation is carried out.  Good hemostasis was noted.  The anterior  peritoneum was closed in a running fashion with 0 Monocryl suture which was  also used to reapproximate the pyramidalis muscle in the midline.  The  anterior rectus fascia is closed in running fashion with 0 PDS suture.  The  skin was closed with clips.  All sponge, instrument, and needle counts were  correct and the patient is transferred to the recovery room in stable  condition.      Guy Sandifer Henderson Cloud, M.D.  Electronically Signed     JET/MEDQ  D:  12/03/2005  T:  12/05/2005  Job:  [id number]

## 2011-02-18 ENCOUNTER — Ambulatory Visit (INDEPENDENT_AMBULATORY_CARE_PROVIDER_SITE_OTHER): Payer: 59

## 2011-02-18 DIAGNOSIS — J111 Influenza due to unidentified influenza virus with other respiratory manifestations: Secondary | ICD-10-CM

## 2011-07-15 ENCOUNTER — Other Ambulatory Visit: Payer: Self-pay | Admitting: Obstetrics and Gynecology

## 2012-07-27 ENCOUNTER — Other Ambulatory Visit: Payer: Self-pay | Admitting: Obstetrics and Gynecology

## 2013-09-13 ENCOUNTER — Other Ambulatory Visit: Payer: Self-pay | Admitting: Obstetrics and Gynecology

## 2013-09-14 LAB — CYTOLOGY - PAP

## 2014-10-24 ENCOUNTER — Other Ambulatory Visit: Payer: Self-pay | Admitting: Obstetrics and Gynecology

## 2014-10-25 LAB — CYTOLOGY - PAP

## 2016-05-21 DIAGNOSIS — E559 Vitamin D deficiency, unspecified: Secondary | ICD-10-CM | POA: Diagnosis not present

## 2016-05-21 DIAGNOSIS — E119 Type 2 diabetes mellitus without complications: Secondary | ICD-10-CM | POA: Diagnosis not present

## 2016-05-21 DIAGNOSIS — Z23 Encounter for immunization: Secondary | ICD-10-CM | POA: Diagnosis not present

## 2016-05-21 DIAGNOSIS — I10 Essential (primary) hypertension: Secondary | ICD-10-CM | POA: Diagnosis not present

## 2016-05-21 DIAGNOSIS — E785 Hyperlipidemia, unspecified: Secondary | ICD-10-CM | POA: Diagnosis not present

## 2016-05-21 DIAGNOSIS — Z0000 Encounter for general adult medical examination without abnormal findings: Secondary | ICD-10-CM | POA: Diagnosis not present

## 2016-06-15 DIAGNOSIS — J301 Allergic rhinitis due to pollen: Secondary | ICD-10-CM | POA: Diagnosis not present

## 2016-06-15 DIAGNOSIS — Z9289 Personal history of other medical treatment: Secondary | ICD-10-CM | POA: Diagnosis not present

## 2016-06-15 DIAGNOSIS — J029 Acute pharyngitis, unspecified: Secondary | ICD-10-CM | POA: Diagnosis not present

## 2016-12-31 DIAGNOSIS — Z803 Family history of malignant neoplasm of breast: Secondary | ICD-10-CM | POA: Diagnosis not present

## 2016-12-31 DIAGNOSIS — Z23 Encounter for immunization: Secondary | ICD-10-CM | POA: Diagnosis not present

## 2016-12-31 DIAGNOSIS — Z01419 Encounter for gynecological examination (general) (routine) without abnormal findings: Secondary | ICD-10-CM | POA: Diagnosis not present

## 2017-01-15 DIAGNOSIS — I10 Essential (primary) hypertension: Secondary | ICD-10-CM | POA: Diagnosis not present

## 2017-01-15 DIAGNOSIS — H60501 Unspecified acute noninfective otitis externa, right ear: Secondary | ICD-10-CM | POA: Diagnosis not present

## 2017-03-13 DIAGNOSIS — E119 Type 2 diabetes mellitus without complications: Secondary | ICD-10-CM | POA: Diagnosis not present

## 2017-03-13 DIAGNOSIS — H40033 Anatomical narrow angle, bilateral: Secondary | ICD-10-CM | POA: Diagnosis not present

## 2017-03-30 DIAGNOSIS — Z809 Family history of malignant neoplasm, unspecified: Secondary | ICD-10-CM | POA: Diagnosis not present

## 2017-03-30 DIAGNOSIS — I10 Essential (primary) hypertension: Secondary | ICD-10-CM | POA: Diagnosis not present

## 2017-04-26 DIAGNOSIS — J0100 Acute maxillary sinusitis, unspecified: Secondary | ICD-10-CM | POA: Diagnosis not present

## 2017-11-19 DIAGNOSIS — H00021 Hordeolum internum right upper eyelid: Secondary | ICD-10-CM | POA: Diagnosis not present

## 2018-01-11 DIAGNOSIS — Z0000 Encounter for general adult medical examination without abnormal findings: Secondary | ICD-10-CM | POA: Diagnosis not present

## 2018-01-11 DIAGNOSIS — Z23 Encounter for immunization: Secondary | ICD-10-CM | POA: Diagnosis not present

## 2018-05-13 ENCOUNTER — Emergency Department (HOSPITAL_COMMUNITY): Payer: BC Managed Care – PPO

## 2018-05-13 ENCOUNTER — Inpatient Hospital Stay (HOSPITAL_COMMUNITY)
Admission: EM | Admit: 2018-05-13 | Discharge: 2018-05-17 | DRG: 287 | Disposition: A | Discharge status: Home / Self Care | Payer: BC Managed Care – PPO | Attending: Family Medicine | Admitting: Family Medicine

## 2018-05-13 ENCOUNTER — Other Ambulatory Visit: Payer: Self-pay

## 2018-05-13 ENCOUNTER — Encounter (HOSPITAL_COMMUNITY): Payer: Self-pay

## 2018-05-13 DIAGNOSIS — I10 Essential (primary) hypertension: Secondary | ICD-10-CM | POA: Diagnosis not present

## 2018-05-13 DIAGNOSIS — R072 Precordial pain: Secondary | ICD-10-CM

## 2018-05-13 DIAGNOSIS — Z7984 Long term (current) use of oral hypoglycemic drugs: Secondary | ICD-10-CM

## 2018-05-13 DIAGNOSIS — E119 Type 2 diabetes mellitus without complications: Secondary | ICD-10-CM | POA: Diagnosis not present

## 2018-05-13 DIAGNOSIS — Z9111 Patient's noncompliance with dietary regimen: Secondary | ICD-10-CM

## 2018-05-13 DIAGNOSIS — E1165 Type 2 diabetes mellitus with hyperglycemia: Secondary | ICD-10-CM | POA: Diagnosis present

## 2018-05-13 DIAGNOSIS — Z7982 Long term (current) use of aspirin: Secondary | ICD-10-CM

## 2018-05-13 DIAGNOSIS — E785 Hyperlipidemia, unspecified: Secondary | ICD-10-CM | POA: Diagnosis present

## 2018-05-13 DIAGNOSIS — R9431 Abnormal electrocardiogram [ECG] [EKG]: Secondary | ICD-10-CM | POA: Diagnosis present

## 2018-05-13 DIAGNOSIS — Z79899 Other long term (current) drug therapy: Secondary | ICD-10-CM

## 2018-05-13 DIAGNOSIS — Z8249 Family history of ischemic heart disease and other diseases of the circulatory system: Secondary | ICD-10-CM

## 2018-05-13 DIAGNOSIS — E1169 Type 2 diabetes mellitus with other specified complication: Secondary | ICD-10-CM

## 2018-05-13 DIAGNOSIS — Z9119 Patient's noncompliance with other medical treatment and regimen: Secondary | ICD-10-CM

## 2018-05-13 DIAGNOSIS — I200 Unstable angina: Secondary | ICD-10-CM | POA: Diagnosis present

## 2018-05-13 DIAGNOSIS — I160 Hypertensive urgency: Principal | ICD-10-CM | POA: Diagnosis present

## 2018-05-13 DIAGNOSIS — G4700 Insomnia, unspecified: Secondary | ICD-10-CM | POA: Diagnosis present

## 2018-05-13 DIAGNOSIS — R079 Chest pain, unspecified: Secondary | ICD-10-CM | POA: Diagnosis present

## 2018-05-13 HISTORY — DX: Essential (primary) hypertension: I10

## 2018-05-13 HISTORY — DX: Type 2 diabetes mellitus without complications: E11.9

## 2018-05-13 LAB — CBC
HEMATOCRIT: 44.4 % (ref 36.0–46.0)
Hemoglobin: 14.6 g/dL (ref 12.0–15.0)
MCH: 29.9 pg (ref 26.0–34.0)
MCHC: 32.9 g/dL (ref 30.0–36.0)
MCV: 91 fL (ref 80.0–100.0)
PLATELETS: 419 10*3/uL — AB (ref 150–400)
RBC: 4.88 MIL/uL (ref 3.87–5.11)
RDW: 11.9 % (ref 11.5–15.5)
WBC: 9.1 10*3/uL (ref 4.0–10.5)
nRBC: 0 % (ref 0.0–0.2)

## 2018-05-13 LAB — TSH: TSH: 1.349 u[IU]/mL (ref 0.350–4.500)

## 2018-05-13 LAB — HEMOGLOBIN A1C
Hgb A1c MFr Bld: 9.7 % — ABNORMAL HIGH (ref 4.8–5.6)
Mean Plasma Glucose: 231.69 mg/dL

## 2018-05-13 LAB — RAPID URINE DRUG SCREEN, HOSP PERFORMED
Amphetamines: NOT DETECTED
Barbiturates: NOT DETECTED
Benzodiazepines: NOT DETECTED
Cocaine: NOT DETECTED
Opiates: POSITIVE — AB
Tetrahydrocannabinol: NOT DETECTED

## 2018-05-13 LAB — GLUCOSE, CAPILLARY
GLUCOSE-CAPILLARY: 165 mg/dL — AB (ref 70–99)
Glucose-Capillary: 329 mg/dL — ABNORMAL HIGH (ref 70–99)

## 2018-05-13 LAB — BASIC METABOLIC PANEL
Anion gap: 11 (ref 5–15)
BUN: 10 mg/dL (ref 6–20)
CALCIUM: 10 mg/dL (ref 8.9–10.3)
CHLORIDE: 102 mmol/L (ref 98–111)
CO2: 23 mmol/L (ref 22–32)
CREATININE: 0.72 mg/dL (ref 0.44–1.00)
GFR calc Af Amer: >60 mL/min (ref >60)
GFR calc non Af Amer: >60 mL/min (ref >60)
GLUCOSE: 230 mg/dL — AB (ref 70–99)
Potassium: 3.9 mmol/L (ref 3.5–5.1)
Sodium: 136 mmol/L (ref 135–145)

## 2018-05-13 LAB — TROPONIN I
Troponin I: <0.03 ng/mL (ref <0.03)
Troponin I: <0.03 ng/mL (ref <0.03)

## 2018-05-13 LAB — I-STAT TROPONIN, ED: Troponin i, poc: 0 ng/mL (ref 0.00–0.08)

## 2018-05-13 LAB — I-STAT BETA HCG BLOOD, ED (MC, WL, AP ONLY)

## 2018-05-13 MED ORDER — SODIUM CHLORIDE 0.9 % IV BOLUS
1000.0000 mL | Freq: Once | INTRAVENOUS | Status: AC
  Administered 2018-05-13: 1000 mL via INTRAVENOUS

## 2018-05-13 MED ORDER — ALUM & MAG HYDROXIDE-SIMETH 200-200-20 MG/5ML PO SUSP
30.0000 mL | Freq: Once | ORAL | Status: DC
  Filled 2018-05-13: qty 30

## 2018-05-13 MED ORDER — ACETAMINOPHEN 325 MG PO TABS
650.0000 mg | ORAL_TABLET | ORAL | Status: DC | PRN
  Administered 2018-05-15 – 2018-05-16 (×5): 650 mg via ORAL
  Filled 2018-05-13 (×7): qty 2

## 2018-05-13 MED ORDER — BENAZEPRIL HCL 10 MG PO TABS
20.0000 mg | ORAL_TABLET | Freq: Every day | ORAL | Status: DC
  Administered 2018-05-13 – 2018-05-16 (×4): 20 mg via ORAL
  Filled 2018-05-13: qty 2
  Filled 2018-05-13 (×5): qty 1

## 2018-05-13 MED ORDER — AMLODIPINE BESYLATE 5 MG PO TABS
5.0000 mg | ORAL_TABLET | Freq: Every day | ORAL | Status: DC
  Administered 2018-05-13 – 2018-05-16 (×4): 5 mg via ORAL
  Filled 2018-05-13 (×4): qty 1

## 2018-05-13 MED ORDER — ONDANSETRON HCL 4 MG/2ML IJ SOLN: 4.0000 mg | Freq: Four times a day (QID) | INTRAMUSCULAR | Status: DC | PRN

## 2018-05-13 MED ORDER — NITROGLYCERIN 0.4 MG SL SUBL
0.4000 mg | SUBLINGUAL_TABLET | SUBLINGUAL | Status: DC | PRN
  Administered 2018-05-13 (×2): 0.4 mg via SUBLINGUAL
  Filled 2018-05-13: qty 1

## 2018-05-13 MED ORDER — ASPIRIN EC 81 MG PO TBEC
81.0000 mg | DELAYED_RELEASE_TABLET | Freq: Every day | ORAL | Status: DC
  Administered 2018-05-14 – 2018-05-17 (×3): 81 mg via ORAL
  Filled 2018-05-13 (×4): qty 1

## 2018-05-13 MED ORDER — AMLODIPINE BESY-BENAZEPRIL HCL 5-20 MG PO CAPS: 1.0000 | ORAL_CAPSULE | Freq: Every day | ORAL | Status: DC

## 2018-05-13 MED ORDER — ENOXAPARIN SODIUM 40 MG/0.4ML ~~LOC~~ SOLN
40.0000 mg | SUBCUTANEOUS | Status: DC
  Filled 2018-05-13: qty 0.4

## 2018-05-13 MED ORDER — MORPHINE SULFATE (PF) 2 MG/ML IV SOLN
2.0000 mg | INTRAVENOUS | Status: DC | PRN
  Administered 2018-05-15: 2 mg via INTRAVENOUS
  Filled 2018-05-13: qty 1

## 2018-05-13 MED ORDER — MORPHINE SULFATE (PF) 4 MG/ML IV SOLN
4.0000 mg | Freq: Once | INTRAVENOUS | Status: AC
  Administered 2018-05-13: 4 mg via INTRAVENOUS
  Filled 2018-05-13: qty 1

## 2018-05-13 MED ORDER — INSULIN ASPART 100 UNIT/ML ~~LOC~~ SOLN
0.0000 [IU] | Freq: Three times a day (TID) | SUBCUTANEOUS | Status: DC
  Administered 2018-05-13: 15 [IU] via SUBCUTANEOUS
  Administered 2018-05-14: 5 [IU] via SUBCUTANEOUS
  Administered 2018-05-15: 3 [IU] via SUBCUTANEOUS
  Administered 2018-05-15: 5 [IU] via SUBCUTANEOUS
  Administered 2018-05-16 – 2018-05-17 (×2): 3 [IU] via SUBCUTANEOUS

## 2018-05-13 MED ORDER — INSULIN ASPART 100 UNIT/ML ~~LOC~~ SOLN: 0.0000 [IU] | Freq: Every day | SUBCUTANEOUS | Status: DC

## 2018-05-13 NOTE — ED Provider Notes (Signed)
[facility] EMERGENCY DEPARTMENT Provider Note   CSN: [id number] Arrival date & time: 05/13/18  1154    History   Chief Complaint Chief Complaint  Patient presents with  . Chest Pain    HPI    Tara [name] is a 48 y.o. female with a PMHx of DM2 and HTN, who presents to the ED with complaints of left-sided chest pain that began last night around 8 PM after she ate fish.  She initially thought it was indigestion, but it has continued, so she went to her PCP this morning and they did an EKG which showed T wave inversions laterally so they sent her here for evaluation.  She describes the pain is currently 4/10 constant achy pressure in the left side of her chest that radiates into the left arm, with no known aggravating factors, somewhat improved with two 81 mg aspirins that she took last night as well as four 81 mg aspirins that she took this morning at her PCPs office.  She also reports having a headache, as well as initially having some tingling in her left arm that has now resolved.  She is compliant with her amlodipine-benazepril 5-20 that she takes at night, took it last night.  She mentions that when she woke up with her chest pain, she noticed that her blood pressure was elevated.  Her PCP is Dr. Harrington Challenger at [facility].  She is non-smoker with no known family history of cardiac disease.  She denies any diaphoresis, lightheadedness, fevers, chills, cough, shortness of breath, leg swelling, recent travel/surgery/immobilization, estrogen use, personal or family history of DVT/PE, abdominal pain, nausea, vomiting, diarrhea, constipation, dysuria, hematuria, numbness, tingling, focal weakness, claudication, orthopnea, or any other complaints at this time.  The history is provided by the patient and medical records. No language interpreter was used.  Chest Pain  Associated symptoms: no abdominal pain, no cough, no diaphoresis, no fever, no nausea, no numbness, no shortness  of breath, no vomiting and no weakness     Past Medical History:  Diagnosis Date  . Diabetes mellitus without complication ([facility])   . Hypertension     There are no active problems to display for this patient.   History reviewed. No pertinent surgical history.   OB History   No obstetric history on file.      Home Medications    Prior to Admission medications   Not on File    Family History History reviewed. No pertinent family history.  Social History Social History   Tobacco Use  . Smoking status: Never Smoker  . Smokeless tobacco: Never Used  Substance Use Topics  . Alcohol use: Yes    Frequency: Never    Comment: occ.  . Drug use: Never     Allergies   Patient has no known allergies.   Review of Systems Review of Systems  Constitutional: Negative for chills, diaphoresis and fever.  Respiratory: Negative for cough and shortness of breath.   Cardiovascular: Positive for chest pain. Negative for leg swelling.  Gastrointestinal: Negative for abdominal pain, constipation, diarrhea, nausea and vomiting.  Genitourinary: Negative for dysuria and hematuria.  Musculoskeletal: Positive for arthralgias (L arm). Negative for myalgias.  Skin: Negative for color change.  Allergic/Immunologic: Negative for immunocompromised state.  Neurological: Negative for weakness, light-headedness and numbness.  Psychiatric/Behavioral: Negative for confusion.   All other systems reviewed and are negative for acute change except as noted in the HPI.    Physical Exam Updated Vital  Signs BP (!) 191/94 (BP Location: Right Arm)   Pulse 98   Temp 98.2 F (36.8 C) (Oral)   Resp 18   Ht 5\' 2"  (1.575 m)   Wt 72.6 kg   SpO2 100%   BMI 29.26 kg/m   Physical Exam Vitals signs and nursing note reviewed.  Constitutional:      General: She is not in acute distress.    Appearance: Normal appearance. She is well-developed. She is not toxic-appearing.     Comments: Afebrile,  nontoxic, NAD, BP 164/97 during eval  HENT:     Head: Normocephalic and atraumatic.  Eyes:     General:        Right eye: No discharge.        Left eye: No discharge.     Conjunctiva/sclera: Conjunctivae normal.  Neck:     Musculoskeletal: Normal range of motion and neck supple.  Cardiovascular:     Rate and Rhythm: Normal rate and regular rhythm.     Pulses: Normal pulses.     Heart sounds: Normal heart sounds, S1 normal and S2 normal. No murmur. No friction rub. No gallop.      Comments: RRR, nl s1/s2, no m/r/g, distal pulses intact, no pedal edema  Pulmonary:     Effort: Pulmonary effort is normal. No respiratory distress.     Breath sounds: Normal breath sounds. No decreased breath sounds, wheezing, rhonchi or rales.     Comments: CTAB in all lung fields, no w/r/r, no hypoxia or increased WOB, speaking in full sentences, SpO2 99% on RA  Chest:     Chest wall: Tenderness present. No deformity or crepitus.     Comments: Chest wall nonTTP without crepitus, deformities, or retractions  Abdominal:     General: Bowel sounds are normal. There is no distension.     Palpations: Abdomen is soft. Abdomen is not rigid.     Tenderness: There is no abdominal tenderness. There is no right CVA tenderness, left CVA tenderness, guarding or rebound. Negative signs include Murphy's sign and McBurney's sign.  Musculoskeletal: Normal range of motion.  Skin:    General: Skin is warm and dry.     Findings: No rash.  Neurological:     Mental Status: She is alert and oriented to person, place, and time.     Sensory: Sensation is intact. No sensory deficit.     Motor: Motor function is intact.  Psychiatric:        Mood and Affect: Mood and affect normal.        Behavior: Behavior normal.      ED Treatments / Results  Labs (all labs ordered are listed, but only abnormal results are displayed) Labs Reviewed  BASIC METABOLIC PANEL - Abnormal; Notable for the following components:      Result  Value   Glucose, Bld 230 (*)    All other components within normal limits  CBC - Abnormal; Notable for the following components:   Platelets 419 (*)    All other components within normal limits  I-STAT TROPONIN, ED  I-STAT BETA HCG BLOOD, ED ([facility], [facility], [facility] ONLY)    EKG EKG Interpretation  Date/Time:  Friday May 13 2018 11:59:44 EST Ventricular Rate:  97 PR Interval:  140 QRS Duration: 78 QT Interval:  348 QTC Calculation: 441 R Axis:   67 Text Interpretation:  Normal sinus rhythm Minimal voltage criteria for LVH, may be normal variant Septal infarct , age undetermined T wave abnormality, consider inferolateral  ischemia Abnormal ECG changed compared to prior 03/19/03 Confirmed by Quintella Reichert [phone]) on 05/13/2018 12:39:56 PM   Radiology Dg Chest 2 View  Result Date: 05/13/2018 CLINICAL DATA:  Chest pain and hypertension. EXAM: CHEST - 2 VIEW COMPARISON:  03/19/2003 FINDINGS: The cardiac silhouette is upper limits of normal in size. The lungs are clear. No pleural effusion or pneumothorax is identified. No acute osseous abnormality is seen. IMPRESSION: No active cardiopulmonary disease. Electronically Signed   By: Logan Bores M.D.   On: 05/13/2018 13:57    Procedures Procedures (including critical care time)  Medications Ordered in ED Medications  nitroGLYCERIN (NITROSTAT) SL tablet 0.4 mg (0.4 mg Sublingual Given 05/13/18 1311)  morphine 4 MG/ML injection 4 mg (4 mg Intravenous Given 05/13/18 1305)  sodium chloride 0.9 % bolus 1,000 mL (1,000 mLs Intravenous New Bag/Given (Non-Interop) 05/13/18 1323)     Initial Impression / Assessment and Plan / ED Course  I have reviewed the triage vital signs and the nursing notes.  Pertinent labs & imaging results that were available during my care of the patient were reviewed by me and considered in my medical decision making (see chart for details).        48 y.o. female here with left-sided chest pain that began around 8 PM as  well as elevated blood pressures.  On exam, no chest wall tenderness, clear lung exam, no pedal edema, no tachycardia or hypoxia, BP 164/97.  EKG with inverted T waves inferolaterally. Work up thus far reveals: CBC with plt 419, trop neg, betaHCG neg, BMP pending, CXR not yet done. Suspect HTN is playing a part. Doubt dissection/PE. Will await BMP and CXR, give morphine and NTG, and reassess. Pt already received ASA.   2:21 PM BMP with gluc 230. CXR negative. Pt now chest pain free after morphine and 2 NTG. BP dropped after NTG but given bolus of fluids and BP improved. Given high heart pathway score and concerning story with EKG changes and HTN urgency, will proceed with admission. Discussed case with my attending Dr. Ralene Bathe who agrees with plan.   2:44 PM Dr. Lorin Mercy of [facility] returning page and will admit. Holding orders to be placed by admitting team. Please see their notes for further documentation of care. I appreciate their help with this pleasant pt's care. Pt stable at time of admission.    Final Clinical Impressions(s) / ED Diagnoses   Final diagnoses:  Precordial chest pain  Hypertensive urgency  Abnormal EKG    ED Discharge Orders    [address] [place], [place] 05/13/18 1444    Quintella Reichert, MD 05/19/18 1048

## 2018-05-13 NOTE — ED Triage Notes (Signed)
Pt arrives POV for eval of chest pressure/L arm pain onset last evening. Pt reports she went to PCP this AM and was sent over for ?changes on EKG w/ ?depression. Pt reports pain is worse in L arm than chest. NARD, NAD, denies SOB.

## 2018-05-13 NOTE — H&P (Signed)
History and Physical    Tara [name] TIW:[id number] DOB: [birth year] DOA: 05/13/2018  PCP: Lawerance Cruel, MD Consultants:  Helane Rima - GYN Patient coming from:  Home - lives with husband and 2 boys; NOK: Husband, [phone]  Chief Complaint: Chest pain  HPI: Tara [name] is a 48 y.o. female with medical history significant of HTN and DM presenting with chest pain.   She hasn't been feeling like herself all week. Last night, she felt pressure in her chest.  She thought maybe it was indigestion.  She went to bed about 930 and woke up shortly after 11 and felt tightening in her hands, symmetric and bilateral.  She also had pain up her left arm and her chest felt heavy.  She takes her BP medication regularly, but when she checked her BP is was 170/104 - it is usually controlled when she is taking her medication.  She did not sleep well, her body felt funny, her calves felt like they were going to cramp.  She went to the doctor this AM for evaluation and had ongoing chest heaviness.  The discomfort was under/behind her left breat and in her left arm.  The PCP did an EKG and he recommended that she come to the ER.  She feels better but tired and she is belching more than usual.  The NTG is what resolved the pain.   ED Course:   Chest pain last night after eating with arm radiation.  This AM, ongoing chest and arm pain.  Went to PCP, EKG with lateral T wave inversions.  Here, BP 190/90s.  Given NTG and morphine with resolution of pain. BP dropped, given IVF, normalized.  HEART pathway recommended observation.  Likely hypertensive urgency and/or ACS.  T wave changes are new, also with LVH.  Review of Systems: As per HPI; otherwise review of systems reviewed and negative.   Ambulatory Status:  Ambulates without assistance  Past Medical History:  Diagnosis Date  . Diabetes mellitus without complication ([place])   . Hypertension     Past Surgical History:  Procedure Laterality Date  .  CESAREAN SECTION      Social History   Socioeconomic History  . Marital status: Married    Spouse name: Not on file  . Number of children: Not on file  . Years of education: Not on file  . Highest education level: Not on file  Occupational History  . Occupation: Technical sales engineer  Social Needs  . Financial resource strain: Not on file  . Food insecurity:    Worry: Not on file    Inability: Not on file  . Transportation needs:    Medical: Not on file    Non-medical: Not on file  Tobacco Use  . Smoking status: Never Smoker  . Smokeless tobacco: Never Used  Substance and Sexual Activity  . Alcohol use: Yes    Frequency: Never    Comment: occ.  . Drug use: Never  . Sexual activity: Not on file  Lifestyle  . Physical activity:    Days per week: Not on file    Minutes per session: Not on file  . Stress: Not on file  Relationships  . Social connections:    Talks on phone: Not on file    Gets together: Not on file    Attends religious service: Not on file    Active member of club or organization: Not on file    Attends meetings of clubs or organizations: Not  on file    Relationship status: Not on file  . Intimate partner violence:    Fear of current or ex partner: Not on file    Emotionally abused: Not on file    Physically abused: Not on file    Forced sexual activity: Not on file  Other Topics Concern  . Not on file  Social History Narrative  . Not on file    No Known Allergies  Family History  Problem Relation Age of Onset  . Heart failure Paternal Uncle     Prior to Admission medications   Medication Sig Start Date End Date Taking? Authorizing Provider  amLODipine-benazepril (LOTREL) 5-20 MG capsule Take 1 capsule by mouth daily. 04/13/18  Yes [provider]  aspirin EC 81 MG tablet Take 81 mg by mouth once.   Yes [provider]  BIOTIN 5000 PO Take 1 tablet by mouth daily.   Yes [provider]  Magnesium 250 MG TABS Take  250 mg by mouth daily.   Yes [provider]  metFORMIN (GLUCOPHAGE-XR) 500 MG 24 hr tablet Take 500 mg by mouth every evening. 05/21/16  Yes [provider]  Multiple Vitamins-Minerals (MULTIVITAMIN WITH MINERALS) tablet Take 1 tablet by mouth daily.   Yes [provider]    Physical Exam: Vitals:   05/13/18 1450 05/13/18 1500 05/13/18 1545 05/13/18 1616  BP: (!) 171/78 (!) 146/82 (!) 149/87 (!) 150/92  Pulse: 93 86 97 93  Resp: (!) 25 16 17    Temp:    98.2 F (36.8 C)  TempSrc:    Oral  SpO2: 100% 97% 98% 99%  Weight:      Height:         . General:  Appears calm and comfortable and is NAD . Eyes:   EOMI, normal lids, iris . ENT:  grossly normal hearing, lips & tongue, mmm; appropriate dentition . Neck:  no LAD, masses or thyromegaly; no carotid bruits . Cardiovascular:  RRR, no m/r/g. No LE edema.  Marland Kitchen Respiratory:   CTA bilaterally with no wheezes/rales/rhonchi.  Normal respiratory effort. . Abdomen:  soft, NT, ND, NABS . Back:   normal alignment, no CVAT . Skin:  no rash or induration seen on limited exam . Musculoskeletal:  grossly normal tone BUE/BLE, good ROM, no bony abnormality . Psychiatric:  grossly normal mood and affect, speech fluent and appropriate, AOx3 . Neurologic:  CN 2-12 grossly intact, moves all extremities in coordinated fashion, sensation intact    Radiological Exams on Admission: Dg Chest 2 View  Result Date: 05/13/2018 CLINICAL DATA:  Chest pain and hypertension. EXAM: CHEST - 2 VIEW COMPARISON:  03/19/2003 FINDINGS: The cardiac silhouette is upper limits of normal in size. The lungs are clear. No pleural effusion or pneumothorax is identified. No acute osseous abnormality is seen. IMPRESSION: No active cardiopulmonary disease. Electronically Signed   By: Logan Bores M.D.   On: 05/13/2018 13:57    EKG: Independently reviewed.  NSR with rate 97; inferolateral T wave inversions that appear to be new   Labs on Admission: I  have personally reviewed the available labs and imaging studies at the time of the admission.  Pertinent labs:   Glucose 230 Troponin 0.00 Platelets 419 HCG <5   Assessment/Plan Active Problems:   Chest pain   Hypertension   Diabetes mellitus without complication (H[facility])   Chest pain -Patient with substernal/left-sided chest pressure that came on without exertion last night, resolved this AM with NTG -2/3 typical  symptoms suggestive of atypical cardiac chest pain.  -CXR unremarkable.   -Initial cardiac troponin negative.  -EKG not indicative of acute ischemia but shows apparent T wave inversion.   -HEART score is 5, indicating that the patient has an elevated risk score and requires further evaluation. -Will plan to place in observation status on telemetry to rule out ACS by overnight observation.  -cycle troponin q6h x 3 and repeat EKG in AM -Start ASA 81 mg daily -morphine given -Risk factor stratification with HgbA1c and FLP; will also check TSH and UDS -Cardiology consultation in AM -NPO for possible stress test  -Will plan to start Heparin drip if enzymes are positive and/or chest pain recurs -Check lipids;consider empiric statin therapy, but given her age and uncertainty about diagnosis of ACS it is reasonable to hold for now  HTN -Reports compliance with medications, but BP uncontrolled while in ER -Suspect some component of HTN urgency as a contributor to today's presentation -Continue amlodipine-benazepril -Consider addition of Metoprolol  DM -Glucose elevated -Patient reports dietary indiscretion -Check A1c -Hold Metformin -Will cover with SSI for now  DVT prophylaxis: Lovenox  Code Status:  Full  Family Communication: None present Disposition Plan:  Home once clinically improved Consults called: Cardiology  Admission status:   It is my clinical opinion that referral for OBSERVATION is reasonable and necessary in this patient based on the above information  provided. The aforementioned taken together are felt to place the patient at high risk for further clinical deterioration. However it is anticipated that the patient may be medically stable for discharge from the hospital within 24 to 48 hours.    Karmen Bongo MD [facility]   How to contact the [facility] Attending or Consulting provider [place] or covering provider during after hours [place], for this patient?  1. Check the care team in [facility] and look for a) attending/consulting [facility] provider listed and b) the [facility] team listed 2. Log into [email or web address] and use [place]'s universal password to access. If you do not have the password, please contact the hospital operator. 3. Locate the [facility] provider you are looking for under [facility] and page to a number that you can be directly reached. 4. If you still have difficulty reaching the provider, please page the [facility] (Director on Call) for the Hospitalists listed on amion for assistance.   05/13/2018, 4:31 PM

## 2018-05-13 NOTE — ED Notes (Signed)
Per EDP give fluids for hypotension after nitro admin

## 2018-05-13 NOTE — ED Notes (Signed)
ED Provider at bedside. 

## 2018-05-13 NOTE — Progress Notes (Signed)
Patient admitted to room 5c07 alert oriented, room air. Admitted s/p chest pain. Currently no chest pain noted. Sinus Tach on tele monitor. Oriented to room and educated on safety precautions.

## 2018-05-14 ENCOUNTER — Observation Stay (HOSPITAL_BASED_OUTPATIENT_CLINIC_OR_DEPARTMENT_OTHER): Payer: BC Managed Care – PPO

## 2018-05-14 ENCOUNTER — Other Ambulatory Visit (HOSPITAL_COMMUNITY): Payer: BC Managed Care – PPO

## 2018-05-14 DIAGNOSIS — E1169 Type 2 diabetes mellitus with other specified complication: Secondary | ICD-10-CM | POA: Diagnosis not present

## 2018-05-14 DIAGNOSIS — R079 Chest pain, unspecified: Secondary | ICD-10-CM | POA: Diagnosis not present

## 2018-05-14 DIAGNOSIS — R0789 Other chest pain: Secondary | ICD-10-CM

## 2018-05-14 DIAGNOSIS — E785 Hyperlipidemia, unspecified: Secondary | ICD-10-CM | POA: Diagnosis not present

## 2018-05-14 DIAGNOSIS — I10 Essential (primary) hypertension: Secondary | ICD-10-CM | POA: Diagnosis not present

## 2018-05-14 DIAGNOSIS — I259 Chronic ischemic heart disease, unspecified: Secondary | ICD-10-CM | POA: Diagnosis not present

## 2018-05-14 LAB — LIPID PANEL
Cholesterol: 291 mg/dL — ABNORMAL HIGH (ref 0–200)
HDL: 61 mg/dL (ref >40)
LDL Cholesterol: 219 mg/dL — ABNORMAL HIGH (ref 0–99)
TRIGLYCERIDES: 54 mg/dL (ref <150)
Total CHOL/HDL Ratio: 4.8 RATIO
VLDL: 11 mg/dL (ref 0–40)

## 2018-05-14 LAB — NM MYOCAR MULTI W/SPECT W/WALL MOTION / EF
CSEPED: 5 min
Estimated workload: 1 METS
Exercise duration (sec): 1 s
Peak HR: 103 {beats}/min
Rest HR: 78 {beats}/min

## 2018-05-14 LAB — GLUCOSE, CAPILLARY
Glucose-Capillary: 177 mg/dL — ABNORMAL HIGH (ref 70–99)
Glucose-Capillary: 184 mg/dL — ABNORMAL HIGH (ref 70–99)
Glucose-Capillary: 215 mg/dL — ABNORMAL HIGH (ref 70–99)
Glucose-Capillary: 188 mg/dL — ABNORMAL HIGH (ref 70–99)

## 2018-05-14 LAB — HIV ANTIBODY (ROUTINE TESTING W REFLEX): HIV Screen 4th Generation wRfx: NONREACTIVE

## 2018-05-14 LAB — ECHOCARDIOGRAM COMPLETE
Height: 62 in
Weight: 2560 oz

## 2018-05-14 LAB — TROPONIN I: Troponin I: <0.03 ng/mL (ref <0.03)

## 2018-05-14 LAB — HEPARIN LEVEL (UNFRACTIONATED): HEPARIN UNFRACTIONATED: 0.35 [IU]/mL (ref 0.30–0.70)

## 2018-05-14 LAB — D-DIMER, QUANTITATIVE: D-Dimer, Quant: 0.33 ug/mL-FEU (ref 0.00–0.50)

## 2018-05-14 MED ORDER — HEPARIN (PORCINE) 25000 UT/250ML-% IV SOLN
950.0000 [IU]/h | INTRAVENOUS | Status: DC
  Administered 2018-05-14: 800 [IU]/h via INTRAVENOUS
  Filled 2018-05-14: qty 500

## 2018-05-14 MED ORDER — HEPARIN BOLUS VIA INFUSION
4000.0000 [IU] | Freq: Once | INTRAVENOUS | Status: AC
  Administered 2018-05-14: 4000 [IU] via INTRAVENOUS
  Filled 2018-05-14: qty 4000

## 2018-05-14 MED ORDER — ATORVASTATIN CALCIUM 40 MG PO TABS
40.0000 mg | ORAL_TABLET | Freq: Every day | ORAL | Status: DC
  Administered 2018-05-14 – 2018-05-16 (×3): 40 mg via ORAL
  Filled 2018-05-14 (×3): qty 1

## 2018-05-14 MED ORDER — REGADENOSON 0.4 MG/5ML IV SOLN
INTRAVENOUS | Status: AC
  Filled 2018-05-14: qty 5

## 2018-05-14 MED ORDER — METOPROLOL TARTRATE 25 MG PO TABS
25.0000 mg | ORAL_TABLET | Freq: Two times a day (BID) | ORAL | Status: DC
  Administered 2018-05-14 – 2018-05-17 (×7): 25 mg via ORAL
  Filled 2018-05-14 (×7): qty 1

## 2018-05-14 MED ORDER — TECHNETIUM TC 99M TETROFOSMIN IV KIT
30.0000 | PACK | Freq: Once | INTRAVENOUS | Status: AC | PRN
  Administered 2018-05-14: 30 via INTRAVENOUS

## 2018-05-14 MED ORDER — REGADENOSON 0.4 MG/5ML IV SOLN
0.4000 mg | Freq: Once | INTRAVENOUS | Status: AC
  Administered 2018-05-14: 0.4 mg via INTRAVENOUS
  Filled 2018-05-14: qty 5

## 2018-05-14 MED ORDER — TECHNETIUM TC 99M TETROFOSMIN IV KIT
10.0000 | PACK | Freq: Once | INTRAVENOUS | Status: AC | PRN
  Administered 2018-05-14: 10 via INTRAVENOUS

## 2018-05-14 NOTE — Progress Notes (Signed)
Update provided to pt and pt family regarding plan of care and new medications  Pt and pt husband understanding  Will continue to monitor

## 2018-05-14 NOTE — Progress Notes (Addendum)
Progress Note    Tara [name]  TIW:[id number] DOB: December 12, 1970  DOA: 05/13/2018 PCP: Lawerance Cruel, MD    Brief Narrative:   Chief complaint: Chest pain  Medical records reviewed and are as summarized below:  Tara [name] is an 48 y.o. female with past medical history significant for hypertension and diabetes mellitus type 2; who presents complaints of chest pain.  Assessment/Plan:   Active Problems:   Chest pain   Hypertension   Diabetes mellitus without complication ([place])  Chest pain: Patient presented with left-sided and substernal chest pressure that was present without exertion.  Notes intermittent complaints of nausea, lightheadedness, and left arm achiness.  Pain improved with aspirin and completely resolved after nitroglycerin.  Heart score is 5.  Troponins negative x3.  EKG noted to be sinus rhythm with signs of LVH and T wave abnormality noted in the lateral leads.  Lipids revealed elevated LDL not at goal.  UDS positive for opiates.  Cardiology was consulted recommended stress test. -Continue aspirin -Check d-dimer(0.33) -Follow-up echocardiogram(EF 60 -65%) -Follow-up stress test(reversible area of ischemia in the distal aspect of the anteroseptal wall of the left ventricle.   -Appreciate cardiology consultative services will follow-up for further recommendation -heparin drip per pharmacy started by cardiology  Diabetes mellitus type 2: Uncontrolled.  Hemoglobin A1c 9.  Patient reports that she has not been very compliant with diet and had recently been started on metformin by her primary care provider. -Hypoglycemic protocols -Carb modified diet -Hold metformin -CBG with moderate SSI -Will need continued outpatient follow-up with primary care provider  Hyperlipidemia: 05/14/2018 lipid panel revealed total cholesterol 291, HDL 61, triglycerides 54, LDL 219.  Goal less than 100. -Start atorvastatin 40 mg  Essential hypertension: Uncontrolled.  Blood  pressures ranged from 85/46 ~191/94.  Low blood pressures after being given nitroglycerin. -Continue amlodipine benazepril  Questionable density of the left atrium: Questionable density seen only on parasternal view.  D-dimer was noted to be negative.  Question need of reevaluation with CTA or TEE.   -Will defer to cardiology  Body mass index is 29.26 kg/m.   Family Communication/Anticipated D/C date and plan/Code Status   DVT prophylaxis: Heparin. Code Status: Full Code.  Family Communication: No family present at bedside Disposition Plan:Possible discharge home in 1 to 2 days if cardiac work-up negative   Medical Consultants:    Cardiology Dr.   Harl Bowie   Anti-Infectives:    None  Subjective:   Patient reports that she has had chest pain that previously had occurred after eating but also reported intermittent episodes of nausea and lightheaded with left arm achiness earlier in the week.  Aspirin helped decrease pain symptoms and nitroglycerin given in the emergency department completely resolved them.  Objective:  Telemetry:Noted sinus tachycardia with heart rates into the 120s once overnight  Vitals:   05/13/18 1826 05/13/18 2308 05/14/18 0555 05/14/18 0832  BP: (!) 153/87 (!) 144/89 (!) 147/98 (!) 143/96  Pulse: (!) 105 97 96 87  Resp:  17 20 19   Temp:  98.1 F (36.7 C) 98.8 F (37.1 C)   TempSrc:  Oral Oral   SpO2:  97% 99%   Weight:      Height:        Intake/Output Summary (Last 24 hours) at 05/14/2018 1009 Last data filed at 05/13/2018 1448 Gross per 24 hour  Intake 1000 ml  Output -  Net 1000 ml   Filed Weights   05/13/18 1209  Weight:  72.6 kg    Exam: Constitutional: NAD, calm, comfortable Eyes: PERRL, lids and conjunctivae normal ENMT: Mucous membranes are moist. Posterior pharynx clear of any exudate or lesions.Normal dentition.  Neck: normal, supple, no masses, no thyromegaly Respiratory: clear to auscultation bilaterally, no wheezing, no  crackles. Normal respiratory effort. No accessory muscle use.  Cardiovascular: Regular rate and rhythm, no murmurs / rubs / gallops. No extremity edema. 2+ pedal pulses. No carotid bruits.  Abdomen: no tenderness, no masses palpated. No hepatosplenomegaly. Bowel sounds positive.  Musculoskeletal: no clubbing / cyanosis. No joint deformity upper and lower extremities. Good ROM, no contractures. Normal muscle tone.  Skin: no rashes, lesions, ulcers. No induration Neurologic: CN 2-12 grossly intact. Sensation intact, DTR normal. Strength 5/5 in all 4.  Psychiatric: Normal judgment and insight. Alert and oriented x 3. Normal mood.    Data Reviewed:   I have personally reviewed following labs and imaging studies:  Labs: Labs show the following:   Basic Metabolic Panel: Recent Labs  Lab 05/13/18 1221  NA 136  K 3.9  CL 102  CO2 23  GLUCOSE 230*  BUN 10  CREATININE 0.72  CALCIUM 10.0   GFR Estimated Creatinine Clearance: 82 mL/min (by C-G formula based on SCr of 0.72 mg/dL). Liver Function Tests: No results for input(s): AST, ALT, ALKPHOS, BILITOT, PROT, ALBUMIN in the last 168 hours. No results for input(s): LIPASE, AMYLASE in the last 168 hours. No results for input(s): AMMONIA in the last 168 hours. Coagulation profile No results for input(s): INR, PROTIME in the last 168 hours.  CBC: Recent Labs  Lab 05/13/18 1221  WBC 9.1  HGB 14.6  HCT 44.4  MCV 91.0  PLT 419*   Cardiac Enzymes: Recent Labs  Lab 05/13/18 1656 05/13/18 2158 05/14/18 0414  TROPONINI <0.03 <0.03 <0.03   BNP (last 3 results) No results for input(s): PROBNP in the last 8760 hours. CBG: Recent Labs  Lab 05/13/18 1830 05/13/18 2143 05/14/18 0441 05/14/18 0732  GLUCAP 329* 165* 177* 184*   D-Dimer: No results for input(s): DDIMER in the last 72 hours. Hgb A1c: Recent Labs    05/13/18 1656  HGBA1C 9.7*   Lipid Profile: Recent Labs    05/14/18 0414  CHOL 291*  HDL 61  LDLCALC 219*   TRIG 54  CHOLHDL 4.8   Thyroid function studies: Recent Labs    05/13/18 1656  TSH 1.349   Anemia work up: No results for input(s): VITAMINB12, FOLATE, FERRITIN, TIBC, IRON, RETICCTPCT in the last 72 hours. Sepsis Labs: Recent Labs  Lab 05/13/18 1221  WBC 9.1    Microbiology No results found for this or any previous visit (from the past 240 hour(s)).  Procedures and diagnostic studies:  Dg Chest 2 View  Result Date: 05/13/2018 CLINICAL DATA:  Chest pain and hypertension. EXAM: CHEST - 2 VIEW COMPARISON:  03/19/2003 FINDINGS: The cardiac silhouette is upper limits of normal in size. The lungs are clear. No pleural effusion or pneumothorax is identified. No acute osseous abnormality is seen. IMPRESSION: No active cardiopulmonary disease. Electronically Signed   By: Logan Bores M.D.   On: 05/13/2018 13:57   Nm Myocar Multi W/spect W/wall Motion / Ef  Result Date: 05/14/2018 CLINICAL DATA:  Chest pain.  Diabetes and hypertension. EXAM: MYOCARDIAL IMAGING WITH SPECT (REST AND PHARMACOLOGIC-STRESS) GATED LEFT VENTRICULAR WALL MOTION STUDY LEFT VENTRICULAR EJECTION FRACTION TECHNIQUE: Standard myocardial SPECT imaging was performed after resting intravenous injection of 10 mCi Tc-5m tetrofosmin. Subsequently, intravenous infusion of Lexiscan  was performed under the supervision of the Cardiology staff. At peak effect of the drug, 30 mCi Tc-61m tetrofosmin was injected intravenously and standard myocardial SPECT imaging was performed. Quantitative gated imaging was also performed to evaluate left ventricular wall motion, and estimate left ventricular ejection fraction. COMPARISON:  None. FINDINGS: Perfusion: A small area of reversibility is seen in the distal anteroseptal wall of the left ventricle, suspicious for myocardial ischemia. No other perfusion defects identified. Wall Motion: Normal left ventricular wall motion. No left ventricular dilation. Left Ventricular Ejection Fraction: 52 %  End diastolic volume 76 ml End systolic volume 36 ml IMPRESSION: 1. Small area of reversibility in the distal anteroseptal wall of the left ventricle, suspicious for inducible myocardial ischemia. 2. Normal left ventricular wall motion. 3. Left ventricular ejection fraction 52% 4. Non invasive risk stratification*: Low *2012 Appropriate Use Criteria for Coronary Revascularization Focused Update: J Am Coll Cardiol. 3086;57(8):469-629. http://content.[email or web address].aspx?articleid=[email or web address] Electronically Signed   By: Earle Gell M.D.   On: 05/14/2018 14:22   Echocardiogram 05/14/2018: Impression  The left ventricle has normal systolic function with an ejection fraction of 60-65%. The cavity size was normal. There is moderately increased left ventricular wall thickness. Left ventricular diastology could not be evaluated due to nondiagnostic  images. The average left ventricular global longitudinal strain is -19.8 %.  2. The right ventricle has normal systolic function. The cavity was normal. There is no increase in right ventricular wall thickness.  3. The tricuspid valve is normal in structure.  4. The pulmonic valve was normal in structure.  5. The average left ventricular global longitudinal strain is -19.8 %.  6. The mitral valve is normal in structure.  7. The aortic valve is normal in structure.  8. Questionable density in the LA but only seen in the parasternal short axis view. Consider Cardiac CTA vs. TEE for better elucidation.  Medications:   . alum & mag hydroxide-simeth  30 mL Oral Once  . amLODipine  5 mg Oral QHS   And  . benazepril  20 mg Oral QHS  . aspirin EC  81 mg Oral Daily  . atorvastatin  40 mg Oral q1800  . enoxaparin (LOVENOX) injection  40 mg Subcutaneous Q24H  . insulin aspart  0-15 Units Subcutaneous TID WC  . insulin aspart  0-5 Units Subcutaneous QHS  . metoprolol tartrate  25 mg Oral BID   Continuous Infusions:   LOS: 0 days   Justine Cossin A Mihaela Fajardo  [facility]   *Please refer to [email or web address], password [email or web address] to get updated schedule on who will round on this patient, as hospitalists switch teams weekly. If 7PM-7AM, please contact night-coverage at [email or web address], password [email or web address] for any overnight needs.

## 2018-05-14 NOTE — Progress Notes (Signed)
PA for cardiology stated to keep pt NPO  Educated pt, pt aware and understanding

## 2018-05-14 NOTE — Consult Note (Addendum)
Cardiology Consultation:   Patient ID: Tara [name] MRN: [id number]; DOB: [birth year]  Admit date: 05/13/2018 Date of Consult: 05/14/2018  Primary Care Provider: Lawerance Cruel, MD Primary Cardiologist: New   Patient Profile:   Tara [name] is a 48 y.o. female with a hx of hypertension and diabetes who is being seen today for the evaluation of chest pain/pressure at the request of Dr. Tamala Julian.  History of Present Illness:   Ms. Salado sent from PCP office for evaluation of chest pain.  The past week patient has not been feeling well.  She has intermittent nausea and fatigue, especially after eating for past 1 week.  Thursday night after eating outside she had substernal chest pressure.  She ate fried fish after long time.  Denies radiation but felt funny on her hand bilaterally.  Her blood pressure was elevated at 170/104.  Eventually fall asleep but uncomfortable all night.  She went to see PCP in the morning yesterday where she continued to have chest discomfort and hypertensive.  EKG was slightly abnormal from prior and sent to ER for further evaluation.  She got nitroglycerin and morphine with resolution of pain.  However she had intermittent chest discomfort.  Upon ER presentation she was hypertensive at 191/94.  Now improved.  Hemoglobin A1c 9.7.  TSH normal.  Skin positive for opioid.  Chest x-ray without acute cardiopulmonary disease.  LDL 219.  Point has been remained negative.  EKG yesterday on presentation showed sinus rhythm at rate of 97 bpm, inferior lateral T wave inversion with LVH criteria-personally reviewed.  Repeat EKG this morning shows sinus rhythm with resolved T wave inversion inferiorly but persisted in lateral leads-personally reviewed  Sinus rhythm on telemetry with heart rate of 90-120s - personally reviewed.   Patient denies prior history of chest pain.  Family history of CAD but has history hypertension.  Denies illicit drug, alcohol or tobacco  smoking.  No regular exercise.  Does noted intermittent dyspnea on exertion.  Intermittent orthopnea.  No lower extremity edema, syncope or dizziness.  She also has noted intermittent palpitation mostly at night.  Past Medical History:  Diagnosis Date  . Diabetes mellitus without complication ([place])   . Hypertension     Past Surgical History:  Procedure Laterality Date  . CESAREAN SECTION      Inpatient Medications: Scheduled Meds: . alum & mag hydroxide-simeth  30 mL Oral Once  . amLODipine  5 mg Oral QHS   And  . benazepril  20 mg Oral QHS  . aspirin EC  81 mg Oral Daily  . atorvastatin  40 mg Oral q1800  . enoxaparin (LOVENOX) injection  40 mg Subcutaneous Q24H  . insulin aspart  0-15 Units Subcutaneous TID WC  . insulin aspart  0-5 Units Subcutaneous QHS   Continuous Infusions:  PRN Meds: acetaminophen, morphine injection, nitroGLYCERIN, ondansetron (ZOFRAN) IV  Allergies:   No Known Allergies  Social History:   Social History   Socioeconomic History  . Marital status: Married    Spouse name: Not on file  . Number of children: Not on file  . Years of education: Not on file  . Highest education level: Not on file  Occupational History  . Occupation: Technical sales engineer  Social Needs  . Financial resource strain: Not on file  . Food insecurity:    Worry: Not on file    Inability: Not on file  . Transportation needs:    Medical: Not on file    Non-medical:  Not on file  Tobacco Use  . Smoking status: Never Smoker  . Smokeless tobacco: Never Used  Substance and Sexual Activity  . Alcohol use: Yes    Frequency: Never    Comment: occ.  . Drug use: Never  . Sexual activity: Not on file  Lifestyle  . Physical activity:    Days per week: Not on file    Minutes per session: Not on file  . Stress: Not on file  Relationships  . Social connections:    Talks on phone: Not on file    Gets together: Not on file    Attends religious service: Not on file     Active member of club or organization: Not on file    Attends meetings of clubs or organizations: Not on file    Relationship status: Not on file  . Intimate partner violence:    Fear of current or ex partner: Not on file    Emotionally abused: Not on file    Physically abused: Not on file    Forced sexual activity: Not on file  Other Topics Concern  . Not on file  Social History Narrative  . Not on file    Family History:   Family History  Problem Relation Age of Onset  . Heart failure Paternal Uncle      ROS:  Please see the history of present illness.  All other ROS reviewed and negative.     Physical Exam/Data:   Vitals:   05/13/18 1826 05/13/18 2308 05/14/18 0555 05/14/18 0832  BP: (!) 153/87 (!) 144/89 (!) 147/98 (!) 143/96  Pulse: (!) 105 97 96 87  Resp:  17 20 19   Temp:  98.1 F (36.7 C) 98.8 F (37.1 C)   TempSrc:  Oral Oral   SpO2:  97% 99%   Weight:      Height:        Intake/Output Summary (Last 24 hours) at 05/14/2018 0853 Last data filed at 05/13/2018 1448 Gross per 24 hour  Intake 1000 ml  Output -  Net 1000 ml   Last 3 Weights 05/13/2018  Weight (lbs) 160 lb  Weight (kg) 72.576 kg     Body mass index is 29.26 kg/m.  General:  Well nourished, well developed, in no acute distress HEENT: normal Lymph: no adenopathy Neck: no JVD Endocrine:  No thryomegaly Vascular: No carotid bruits; FA pulses 2+ bilaterally without bruits  Cardiac:  normal S1, S2; RRR; no murmur  Lungs:  clear to auscultation bilaterally, no wheezing, rhonchi or rales  Abd: soft, nontender, no hepatomegaly  Ext: no edema Musculoskeletal:  No deformities, BUE and BLE strength normal and equal Skin: warm and dry  Neuro:  CNs 2-12 intact, no focal abnormalities noted Psych:  Normal affect    Relevant CV Studies: Pending echocardiogram  Laboratory Data:  Chemistry Recent Labs  Lab 05/13/18 1221  NA 136  K 3.9  CL 102  CO2 23  GLUCOSE 230*  BUN 10  CREATININE  0.72  CALCIUM 10.0  GFRNONAA >60  GFRAA >60  ANIONGAP 11    No results for input(s): PROT, ALBUMIN, AST, ALT, ALKPHOS, BILITOT in the last 168 hours. Hematology Recent Labs  Lab 05/13/18 1221  WBC 9.1  RBC 4.88  HGB 14.6  HCT 44.4  MCV 91.0  MCH 29.9  MCHC 32.9  RDW 11.9  PLT 419*   Cardiac Enzymes Recent Labs  Lab 05/13/18 1656 05/13/18 2158 05/14/18 0414  TROPONINI <0.03 <0.03 <0.03  Recent Labs  Lab 05/13/18 1234  TROPIPOC 0.00     Radiology/Studies:  Dg Chest 2 View  Result Date: 05/13/2018 CLINICAL DATA:  Chest pain and hypertension. EXAM: CHEST - 2 VIEW COMPARISON:  03/19/2003 FINDINGS: The cardiac silhouette is upper limits of normal in size. The lungs are clear. No pleural effusion or pneumothorax is identified. No acute osseous abnormality is seen. IMPRESSION: No active cardiopulmonary disease. Electronically Signed   By: Logan Bores M.D.   On: 05/13/2018 13:57    Assessment and Plan:   1. Chest pain with moderate cardiac etiology -Intermittent chest pressure since Thursday night.  Exacerbated after eating.  Minimal radiation to left arm.  Improved after sublingual nitroglycerin.  Troponin has been remained negative.  EKG inferior lateral T wave inversion with LVH criteria.  Repeat EKG this morning showed resolution of inferior T wave inversion but persisted in lateral lead.  Telemetry with sinus tachycardia in 120s. -Differential includes GI, tachycardia, hypertensive urgency or unstable angina. -Cardiac risk factor includes hypertension and uncontrolled diabetes.  Sedentary lifestyle. -Pending echocardiogram this admission.  Keep n.p.o.  Will review with MD for inpatient versus outpatient stress test.  2.  Hypertension -Elevated on presentation, proving.  Patient endorsed compliant with her medication at home. -Continue home amlodipine and benazepril. -Given sinus tachycardia will add metoprolol 25 mg twice daily.  3.  Controlled  diabetes -Hemoglobin A1c greater than 9.  Noncompliant with diet and exercise. -Primary team.  4. HLD - 05/14/2018: Cholesterol 291; HDL 61; LDL Cholesterol 219; Triglycerides 54; VLDL 11  - Continue statin   For questions or updates, please contact [place] Please consult [email or web address] for contact info under     Jarrett Soho, [place]  05/14/2018 8:53 AM   Patient seen and discussed with PA Bhagat, I agree with his documentation. History of HTN, DM2 presents with chest pain  Symptoms started Thursday night. Left sided 10/10 pressure with some SOB associated. Lasted all night long, not positional. Radiated down left arm.  Improved with SL NG in ER. Currently pain free.    WBC 9.1 Hgb 14.6 Plt 419 K 3.9 Cr 0.72 TSH 1.34 HgbA1c 9,7 UDS +opiates LDL 219 Trop neg -->neg-->neg-->neg--> CXR no acute process Echo pending EKG  SR, LVH. Inferior and lateral precordial TWIs, either LVH strain vs ischemia.  Multiple poorly controlled CAD risk ractors, presents with chest pressure mixed in description. We will plan for a lexiscan to further evaluate. She recalls being on a statin in the past, not sure why it was stopped, she does not recall any myalgias or liver issues. Agree with starting atorva 40mg  dialy.   F/u stress test, f/u echo.   Some sinus tach overnight, resolved this AM. If negative cardiac workup, would consider checking D-dimer and following up echo to look at her RV.    Carlyle Dolly MD

## 2018-05-14 NOTE — Progress Notes (Signed)
Pt requesting to shower  Informed MD via message  MD stated okay  Will inform pt

## 2018-05-14 NOTE — Progress Notes (Signed)
ANTICOAGULATION CONSULT NOTE - [place] for Heparin Indication: chest pain/ACS  No Known Allergies  Patient Measurements: Height: 5\' 2"  (157.5 cm) Weight: 160 lb (72.6 kg) IBW/kg (Calculated) : 50.1 Heparin Dosing Weight: 65.6kg  Vital Signs: Temp: 98.6 F (37 C) (02/29 1828) Temp Source: Oral (02/29 1828) BP: 148/99 (02/29 2150) Pulse Rate: 86 (02/29 2150)  Labs: Recent Labs    05/13/18 1221 05/13/18 1656 05/13/18 2158 05/14/18 0414 05/14/18 2129  HGB 14.6  --   --   --   --   HCT 44.4  --   --   --   --   PLT 419*  --   --   --   --   HEPARINUNFRC  --   --   --   --  0.35  CREATININE 0.72  --   --   --   --   TROPONINI  --  <0.03 <0.03 <0.03  --     Estimated Creatinine Clearance: 82 mL/min (by C-G formula based on SCr of 0.72 mg/dL).   Medical History: Past Medical History:  Diagnosis Date  . Diabetes mellitus without complication ([place])   . Hypertension    Assessment: Pt is a 48 YO F being evaluated for CP/pressure. Pharmacy consulted to dose heparin. Pt not on AC PTA. Cath likely Monday.   Currently on IV heparin at 800 units/hr. Initial heparin level is therapeutic at 0.35.   Goal of Therapy:  Heparin level 0.3-0.7 units/ml Monitor platelets by anticoagulation protocol: Yes   Plan:  Continue heparin gtt 800 units/hr F/u AM HL  Monitor daily HL, s/sx bleeding, CBC   Albertina Parr, PharmD., BCPS Clinical Pharmacist Clinical phone for 05/14/18 until 10:30pm: [phone]

## 2018-05-14 NOTE — Progress Notes (Signed)
  Echocardiogram 2D Echocardiogram has been performed.  [name] 05/14/2018, 2:16 PM

## 2018-05-14 NOTE — Progress Notes (Signed)
Stress with small area of ischemia. Though overall low risk ischemia by imaging, her clinical syndrome is worrisome for unstable angina. Follow echo for further risk stratification, would start hep gtt. Would likely plan for cath Monday   Zandra Abts MD

## 2018-05-14 NOTE — Progress Notes (Signed)
MD Branch aware pt IV site removed due to tenderness at site after stress test  MD aware pt does not have access at this time MD to review stress test, possible discharge later today  Will continue to monitor

## 2018-05-14 NOTE — Progress Notes (Signed)
Pt transported to nuc. med. via wheelchair with transporter

## 2018-05-14 NOTE — Progress Notes (Signed)
Assisted pt in ordering lunch  

## 2018-05-14 NOTE — Progress Notes (Signed)
Patient presented for Lexiscan. Tolerated procedure well. Pending final stress imaging result.  

## 2018-05-14 NOTE — Progress Notes (Signed)
ANTICOAGULATION CONSULT NOTE - Initial Consult  Pharmacy Consult for Heparin Indication: chest pain/ACS  No Known Allergies  Patient Measurements: Height: 5\' 2"  (157.5 cm) Weight: 160 lb (72.6 kg) IBW/kg (Calculated) : 50.1 Heparin Dosing Weight: 65.6kg  Vital Signs: Temp: 98.3 F (36.8 C) (02/29 1310) Temp Source: Oral (02/29 1310) BP: 155/89 (02/29 1310) Pulse Rate: 89 (02/29 1310)  Labs: Recent Labs    05/13/18 1221 05/13/18 1656 05/13/18 2158 05/14/18 0414  HGB 14.6  --   --   --   HCT 44.4  --   --   --   PLT 419*  --   --   --   CREATININE 0.72  --   --   --   TROPONINI  --  <0.03 <0.03 <0.03    Estimated Creatinine Clearance: 82 mL/min (by C-G formula based on SCr of 0.72 mg/dL).   Medical History: Past Medical History:  Diagnosis Date  . Diabetes mellitus without complication ([place])   . Hypertension    Assessment: Pt is a 48 YO F being evaluated for CP/pressure. Pharmacy consulted to dose heparin. Pt not on AC PTA. Cath likely Monday.   SCR 0.72, Hgb 14.6, plts ok  Goal of Therapy:  Heparin level 0.3-0.7 units/ml Monitor platelets by anticoagulation protocol: Yes   Plan:  Give 4000 units bolus x 1  Heparin gtt 800 units/hr Check heparin level in 6 hours  Monitor daily HL, s/sx bleeding, CBC   Juanell Fairly, PharmD PGY1 Pharmacy Resident Phone [phone] 05/14/2018 3:30 PM

## 2018-05-14 NOTE — Progress Notes (Signed)
MD Branch stated to place new IV due to abnormal stress test results  MD at bedside now

## 2018-05-15 DIAGNOSIS — Z7982 Long term (current) use of aspirin: Secondary | ICD-10-CM | POA: Diagnosis not present

## 2018-05-15 DIAGNOSIS — R0789 Other chest pain: Secondary | ICD-10-CM | POA: Diagnosis not present

## 2018-05-15 DIAGNOSIS — Z9111 Patient's noncompliance with dietary regimen: Secondary | ICD-10-CM | POA: Diagnosis not present

## 2018-05-15 DIAGNOSIS — I160 Hypertensive urgency: Secondary | ICD-10-CM | POA: Diagnosis present

## 2018-05-15 DIAGNOSIS — G4700 Insomnia, unspecified: Secondary | ICD-10-CM | POA: Diagnosis present

## 2018-05-15 DIAGNOSIS — Z7984 Long term (current) use of oral hypoglycemic drugs: Secondary | ICD-10-CM | POA: Diagnosis not present

## 2018-05-15 DIAGNOSIS — E1165 Type 2 diabetes mellitus with hyperglycemia: Secondary | ICD-10-CM | POA: Diagnosis present

## 2018-05-15 DIAGNOSIS — Z8249 Family history of ischemic heart disease and other diseases of the circulatory system: Secondary | ICD-10-CM | POA: Diagnosis not present

## 2018-05-15 DIAGNOSIS — E785 Hyperlipidemia, unspecified: Secondary | ICD-10-CM | POA: Diagnosis present

## 2018-05-15 DIAGNOSIS — I10 Essential (primary) hypertension: Secondary | ICD-10-CM | POA: Diagnosis present

## 2018-05-15 DIAGNOSIS — I5189 Other ill-defined heart diseases: Secondary | ICD-10-CM | POA: Diagnosis not present

## 2018-05-15 DIAGNOSIS — I200 Unstable angina: Secondary | ICD-10-CM | POA: Diagnosis present

## 2018-05-15 DIAGNOSIS — Z79899 Other long term (current) drug therapy: Secondary | ICD-10-CM | POA: Diagnosis not present

## 2018-05-15 DIAGNOSIS — Z9119 Patient's noncompliance with other medical treatment and regimen: Secondary | ICD-10-CM | POA: Diagnosis not present

## 2018-05-15 DIAGNOSIS — I259 Chronic ischemic heart disease, unspecified: Secondary | ICD-10-CM

## 2018-05-15 DIAGNOSIS — R072 Precordial pain: Secondary | ICD-10-CM | POA: Diagnosis present

## 2018-05-15 DIAGNOSIS — R9430 Abnormal result of cardiovascular function study, unspecified: Secondary | ICD-10-CM | POA: Diagnosis not present

## 2018-05-15 DIAGNOSIS — R9431 Abnormal electrocardiogram [ECG] [EKG]: Secondary | ICD-10-CM | POA: Diagnosis present

## 2018-05-15 DIAGNOSIS — E1169 Type 2 diabetes mellitus with other specified complication: Secondary | ICD-10-CM | POA: Diagnosis present

## 2018-05-15 LAB — CBC
HCT: 38.8 % (ref 36.0–46.0)
Hemoglobin: 13.2 g/dL (ref 12.0–15.0)
MCH: 30.5 pg (ref 26.0–34.0)
MCHC: 34 g/dL (ref 30.0–36.0)
MCV: 89.6 fL (ref 80.0–100.0)
Platelets: 395 10*3/uL (ref 150–400)
RBC: 4.33 MIL/uL (ref 3.87–5.11)
RDW: 11.6 % (ref 11.5–15.5)
WBC: 9.1 10*3/uL (ref 4.0–10.5)
nRBC: 0 % (ref 0.0–0.2)

## 2018-05-15 LAB — GLUCOSE, CAPILLARY
GLUCOSE-CAPILLARY: 180 mg/dL — AB (ref 70–99)
Glucose-Capillary: 216 mg/dL — ABNORMAL HIGH (ref 70–99)
Glucose-Capillary: 155 mg/dL — ABNORMAL HIGH (ref 70–99)
Glucose-Capillary: 117 mg/dL — ABNORMAL HIGH (ref 70–99)

## 2018-05-15 LAB — TROPONIN I: Troponin I: <0.03 ng/mL (ref <0.03)

## 2018-05-15 LAB — HEPARIN LEVEL (UNFRACTIONATED)
Heparin Unfractionated: 0.25 IU/mL — ABNORMAL LOW (ref 0.30–0.70)
Heparin Unfractionated: 0.3 IU/mL (ref 0.30–0.70)
Heparin Unfractionated: 0.54 IU/mL (ref 0.30–0.70)

## 2018-05-15 MED ORDER — NITROGLYCERIN 2 % TD OINT
0.5000 [in_us] | TOPICAL_OINTMENT | Freq: Four times a day (QID) | TRANSDERMAL | Status: DC
  Administered 2018-05-15 – 2018-05-16 (×3): 0.5 [in_us] via TOPICAL
  Filled 2018-05-15 (×4): qty 1

## 2018-05-15 MED ORDER — SODIUM CHLORIDE 0.9 % WEIGHT BASED INFUSION
1.0000 mL/kg/h | INTRAVENOUS | Status: DC
  Administered 2018-05-15: 1 mL/kg/h via INTRAVENOUS

## 2018-05-15 MED ORDER — SODIUM CHLORIDE 0.9% FLUSH: 3.0000 mL | INTRAVENOUS | Status: DC | PRN

## 2018-05-15 MED ORDER — SODIUM CHLORIDE 0.9% FLUSH
3.0000 mL | Freq: Two times a day (BID) | INTRAVENOUS | Status: DC
  Administered 2018-05-15: 3 mL via INTRAVENOUS

## 2018-05-15 MED ORDER — ZOLPIDEM TARTRATE 5 MG PO TABS: 5.0000 mg | ORAL_TABLET | Freq: Every evening | ORAL | Status: DC | PRN

## 2018-05-15 MED ORDER — INSULIN GLARGINE 100 UNIT/ML ~~LOC~~ SOLN
5.0000 [IU] | Freq: Every day | SUBCUTANEOUS | Status: DC
  Administered 2018-05-15: 5 [IU] via SUBCUTANEOUS
  Filled 2018-05-15 (×2): qty 0.05

## 2018-05-15 MED ORDER — ASPIRIN 81 MG PO CHEW
81.0000 mg | CHEWABLE_TABLET | ORAL | Status: AC
  Administered 2018-05-16: 81 mg via ORAL

## 2018-05-15 MED ORDER — SODIUM CHLORIDE 0.9 % IV SOLN: 250.0000 mL | INTRAVENOUS | Status: DC | PRN

## 2018-05-15 NOTE — Progress Notes (Addendum)
Progress Note    Tara [name]  VOJ:[id number] DOB: 01-17-1971  DOA: 05/13/2018 PCP: Lawerance Cruel, MD    Brief Narrative:   Chief complaint: Chest pain  Medical records reviewed and are as summarized below:  Tara [name] is an 48 y.o. female with past medical history significant for hypertension and diabetes mellitus type 2; who presents complaints of chest pain.  Assessment/Plan:   Principal Problem:   Chest pain Active Problems:   Hypertension   Type 2 diabetes mellitus with hyperlipidemia (HCC)  Chest pain: Patient presented with left-sided and substernal chest pressure that was present without exertion.  Notes intermittent complaints of nausea, lightheadedness, and left arm achiness.  Pain improved with aspirin and completely resolved after nitroglycerin.  Heart score is 5.  Troponins negative x3.  EKG noted to be sinus rhythm with signs of LVH and T wave abnormality noted in the lateral leads.  Lipids revealed elevated LDL not at goal. D-dimer was negative.  Cardiology was consulted recommended stress test.stress test revealed a reversible area of ischemia in the distal aspect of the anteroseptal wall of the left ventricle.  Patient noted intermittent chest achiness overnight.  Cardiology started nitroglycerin patch. -Continue aspirin -Heparin drip per pharmacy -Plan for cardiac catheterization on 05/16/2018 -Nitro patch for chest pain symptoms -Appreciate cardiology consultative services  Diabetes mellitus type 2: Uncontrolled.  Hemoglobin A1c 9.  Patient reports that she has not been very compliant with diet and had recently been started on metformin by her primary care provider. -Hypoglycemic protocols -Carb modified diet -Hold metformin -CBG with moderate SSI -Will need continued outpatient follow-up  Hyperlipidemia: 05/14/2018 lipid panel revealed total cholesterol 291, HDL 61, triglycerides 54, LDL 219.  Goal less than 70.  Atorvastatin was  started. -Continue atorvastatin 40 mg  Essential hypertension: Uncontrolled.  Blood pressures still appear to be elevated. -Continue amlodipine, benazepril, and metoprolol  Questionable density of the left atrium: Questionable density seen only on parasternal view.  D-dimer was noted to be negative.  Question need of reevaluation with CTA or TEE.   -Cardiology recommending possible TEE after cardiac cath  Insomnia: Patient reported difficulty sleeping overnight. -Ambien as needed for sleep   Body mass index is 29.26 kg/m.   Family Communication/Anticipated D/C date and plan/Code Status   DVT prophylaxis: Heparin gtt per pharmacy Code Status: Full Code.  Family Communication: No family present at bedside Disposition Plan:Possible discharge home in 1 to 2 days if cardiac work-up negative   Medical Consultants:    Cardiology Dr.   Harl Bowie   Anti-Infectives:    None  Subjective:   Patient reports that she had some difficulty in sleeping overnight and had some achiness in her chest.  She did not ask for nitroglycerin.  Objective:   Telemetry: Intermittent PVCs and tachycardia into the 120s.  Vitals:   05/14/18 1142 05/14/18 1310 05/14/18 1828 05/14/18 2150  BP:  (!) 155/89 (!) 153/95 (!) 148/99  Pulse: 90 89 98 86  Resp:  18 18 18   Temp:  98.3 F (36.8 C) 98.6 F (37 C) 97.8 F (36.6 C)  TempSrc:  Oral Oral Oral  SpO2:  99% 98% 98%  Weight:      Height:        Intake/Output Summary (Last 24 hours) at 05/15/2018 0658 Last data filed at 05/15/2018 0400 Gross per 24 hour  Intake 976.46 ml  Output -  Net 976.46 ml   Filed Weights   05/13/18 1209  Weight: 72.6 kg    Exam: Constitutional: NAD, calm, comfortable Eyes: PERRL, lids and conjunctivae normal ENMT: Mucous membranes are moist. Posterior pharynx clear of any exudate or lesions.Normal dentition.  Neck: normal, supple, no masses, no thyromegaly Respiratory: clear to auscultation bilaterally, no  wheezing, no crackles. Normal respiratory effort. No accessory muscle use.  Cardiovascular: Regular rate and rhythm, no murmurs / rubs / gallops. No extremity edema. 2+ pedal pulses. No carotid bruits.  Abdomen: no tenderness, no masses palpated. No hepatosplenomegaly. Bowel sounds positive.  Musculoskeletal: no clubbing / cyanosis. No joint deformity upper and lower extremities. Good ROM, no contractures. Normal muscle tone.  Skin: no rashes, lesions, ulcers. No induration Neurologic: CN 2-12 grossly intact. Sensation intact, DTR normal. Strength 5/5 in all 4.  Psychiatric: Normal judgment and insight. Alert and oriented x 3. Normal mood.    Data Reviewed:   I have personally reviewed following labs and imaging studies:  Labs: Labs show the following:   Basic Metabolic Panel: Recent Labs  Lab 05/13/18 1221  NA 136  K 3.9  CL 102  CO2 23  GLUCOSE 230*  BUN 10  CREATININE 0.72  CALCIUM 10.0   GFR Estimated Creatinine Clearance: 82 mL/min (by C-G formula based on SCr of 0.72 mg/dL). Liver Function Tests: No results for input(s): AST, ALT, ALKPHOS, BILITOT, PROT, ALBUMIN in the last 168 hours. No results for input(s): LIPASE, AMYLASE in the last 168 hours. No results for input(s): AMMONIA in the last 168 hours. Coagulation profile No results for input(s): INR, PROTIME in the last 168 hours.  CBC: Recent Labs  Lab 05/13/18 1221 05/15/18 0400  WBC 9.1 9.1  HGB 14.6 13.2  HCT 44.4 38.8  MCV 91.0 89.6  PLT 419* 395   Cardiac Enzymes: Recent Labs  Lab 05/13/18 1656 05/13/18 2158 05/14/18 0414  TROPONINI <0.03 <0.03 <0.03   BNP (last 3 results) No results for input(s): PROBNP in the last 8760 hours. CBG: Recent Labs  Lab 05/14/18 0441 05/14/18 0732 05/14/18 1617 05/14/18 2157 05/15/18 0550  GLUCAP 177* 184* 215* 188* 216*   D-Dimer: Recent Labs    05/14/18 1257  DDIMER 0.33   Hgb A1c: Recent Labs    05/13/18 1656  HGBA1C 9.7*   Lipid  Profile: Recent Labs    05/14/18 0414  CHOL 291*  HDL 61  LDLCALC 219*  TRIG 54  CHOLHDL 4.8   Thyroid function studies: Recent Labs    05/13/18 1656  TSH 1.349   Anemia work up: No results for input(s): VITAMINB12, FOLATE, FERRITIN, TIBC, IRON, RETICCTPCT in the last 72 hours. Sepsis Labs: Recent Labs  Lab 05/13/18 1221 05/15/18 0400  WBC 9.1 9.1    Microbiology No results found for this or any previous visit (from the past 240 hour(s)).  Procedures and diagnostic studies:  Dg Chest 2 View  Result Date: 05/13/2018 CLINICAL DATA:  Chest pain and hypertension. EXAM: CHEST - 2 VIEW COMPARISON:  03/19/2003 FINDINGS: The cardiac silhouette is upper limits of normal in size. The lungs are clear. No pleural effusion or pneumothorax is identified. No acute osseous abnormality is seen. IMPRESSION: No active cardiopulmonary disease. Electronically Signed   By: Logan Bores M.D.   On: 05/13/2018 13:57   Nm Myocar Multi W/spect W/wall Motion / Ef  Result Date: 05/14/2018 CLINICAL DATA:  Chest pain.  Diabetes and hypertension. EXAM: MYOCARDIAL IMAGING WITH SPECT (REST AND PHARMACOLOGIC-STRESS) GATED LEFT VENTRICULAR WALL MOTION STUDY LEFT VENTRICULAR EJECTION FRACTION TECHNIQUE: Standard myocardial SPECT imaging  was performed after resting intravenous injection of 10 mCi Tc-35m tetrofosmin. Subsequently, intravenous infusion of Lexiscan was performed under the supervision of the Cardiology staff. At peak effect of the drug, 30 mCi Tc-72m tetrofosmin was injected intravenously and standard myocardial SPECT imaging was performed. Quantitative gated imaging was also performed to evaluate left ventricular wall motion, and estimate left ventricular ejection fraction. COMPARISON:  None. FINDINGS: Perfusion: A small area of reversibility is seen in the distal anteroseptal wall of the left ventricle, suspicious for myocardial ischemia. No other perfusion defects identified. Wall Motion: Normal left  ventricular wall motion. No left ventricular dilation. Left Ventricular Ejection Fraction: 52 % End diastolic volume 76 ml End systolic volume 36 ml IMPRESSION: 1. Small area of reversibility in the distal anteroseptal wall of the left ventricle, suspicious for inducible myocardial ischemia. 2. Normal left ventricular wall motion. 3. Left ventricular ejection fraction 52% 4. Non invasive risk stratification*: Low *2012 Appropriate Use Criteria for Coronary Revascularization Focused Update: J Am Coll Cardiol. 1157;26(2):035-597. http://content.[email or web address]?[email or web address] Electronically Signed   By: Earle Gell M.D.   On: 05/14/2018 14:22   Echocardiogram 05/14/2018: Impression  The left ventricle has normal systolic function with an ejection fraction of 60-65%. The cavity size was normal. There is moderately increased left ventricular wall thickness. Left ventricular diastology could not be evaluated due to nondiagnostic  images. The average left ventricular global longitudinal strain is -19.8 %.  2. The right ventricle has normal systolic function. The cavity was normal. There is no increase in right ventricular wall thickness.  3. The tricuspid valve is normal in structure.  4. The pulmonic valve was normal in structure.  5. The average left ventricular global longitudinal strain is -19.8 %.  6. The mitral valve is normal in structure.  7. The aortic valve is normal in structure.  8. Questionable density in the LA but only seen in the parasternal short axis view. Consider Cardiac CTA vs. TEE for better elucidation.  Medications:   . alum & mag hydroxide-simeth  30 mL Oral Once  . amLODipine  5 mg Oral QHS   And  . benazepril  20 mg Oral QHS  . aspirin EC  81 mg Oral Daily  . atorvastatin  40 mg Oral q1800  . insulin aspart  0-15 Units Subcutaneous TID WC  . insulin aspart  0-5 Units Subcutaneous QHS  . metoprolol tartrate  25 mg Oral BID   Continuous Infusions: . heparin  800 Units/hr (05/15/18 0400)     LOS: 0 days   Rondell A Smith  [facility]   *Please refer to [email or web address], password [email or web address] to get updated schedule on who will round on this patient, as hospitalists switch teams weekly. If 7PM-7AM, please contact night-coverage at [email or web address], password [email or web address] for any overnight needs.

## 2018-05-15 NOTE — Progress Notes (Addendum)
Progress Note  Patient Name: Tara [name] Date of Encounter: 05/15/2018  Primary Cardiologist: New  Subjective   Some chest pain overnight  Inpatient Medications    Scheduled Meds: . alum & mag hydroxide-simeth  30 mL Oral Once  . amLODipine  5 mg Oral QHS   And  . benazepril  20 mg Oral QHS  . aspirin EC  81 mg Oral Daily  . atorvastatin  40 mg Oral q1800  . insulin aspart  0-15 Units Subcutaneous TID WC  . insulin aspart  0-5 Units Subcutaneous QHS  . insulin glargine  5 Units Subcutaneous Daily  . metoprolol tartrate  25 mg Oral BID   Continuous Infusions: . heparin 900 Units/hr (05/15/18 0848)   PRN Meds: acetaminophen, morphine injection, nitroGLYCERIN, ondansetron (ZOFRAN) IV, zolpidem   Vital Signs    Vitals:   05/14/18 1142 05/14/18 1310 05/14/18 1828 05/14/18 2150  BP:  (!) 155/89 (!) 153/95 (!) 148/99  Pulse: 90 89 98 86  Resp:  18 18 18   Temp:  98.3 F (36.8 C) 98.6 F (37 C) 97.8 F (36.6 C)  TempSrc:  Oral Oral Oral  SpO2:  99% 98% 98%  Weight:      Height:        Intake/Output Summary (Last 24 hours) at 05/15/2018 1041 Last data filed at 05/15/2018 0400 Gross per 24 hour  Intake 976.46 ml  Output -  Net 976.46 ml   Last 3 Weights 05/13/2018  Weight (lbs) 160 lb  Weight (kg) 72.576 kg      Telemetry    NSR - Personally Reviewed  ECG    na  Physical Exam   GEN: No acute distress.   Neck: No JVD Cardiac: RRR, no murmurs, rubs, or gallops.  Respiratory: Clear to auscultation bilaterally. GI: Soft, nontender, non-distended  MS: No edema; No deformity. Neuro:  Nonfocal  Psych: Normal affect   Labs    Chemistry Recent Labs  Lab 05/13/18 1221  NA 136  K 3.9  CL 102  CO2 23  GLUCOSE 230*  BUN 10  CREATININE 0.72  CALCIUM 10.0  GFRNONAA >60  GFRAA >60  ANIONGAP 11     Hematology Recent Labs  Lab 05/13/18 1221 05/15/18 0400  WBC 9.1 9.1  RBC 4.88 4.33  HGB 14.6 13.2  HCT 44.4 38.8  MCV 91.0 89.6  MCH 29.9  30.5  MCHC 32.9 34.0  RDW 11.9 11.6  PLT 419* 395    Cardiac Enzymes Recent Labs  Lab 05/13/18 1656 05/13/18 2158 05/14/18 0414 05/15/18 0813  TROPONINI <0.03 <0.03 <0.03 <0.03    Recent Labs  Lab 05/13/18 1234  TROPIPOC 0.00     BNPNo results for input(s): BNP, PROBNP in the last 168 hours.   DDimer  Recent Labs  Lab 05/14/18 1257  DDIMER 0.33     Radiology    Dg Chest 2 View  Result Date: 05/13/2018 CLINICAL DATA:  Chest pain and hypertension. EXAM: CHEST - 2 VIEW COMPARISON:  03/19/2003 FINDINGS: The cardiac silhouette is upper limits of normal in size. The lungs are clear. No pleural effusion or pneumothorax is identified. No acute osseous abnormality is seen. IMPRESSION: No active cardiopulmonary disease. Electronically Signed   By: Logan Bores M.D.   On: 05/13/2018 13:57   Nm Myocar Multi W/spect W/wall Motion / Ef  Result Date: 05/14/2018 CLINICAL DATA:  Chest pain.  Diabetes and hypertension. EXAM: MYOCARDIAL IMAGING WITH SPECT (REST AND PHARMACOLOGIC-STRESS) GATED LEFT VENTRICULAR WALL MOTION  STUDY LEFT VENTRICULAR EJECTION FRACTION TECHNIQUE: Standard myocardial SPECT imaging was performed after resting intravenous injection of 10 mCi Tc-67m tetrofosmin. Subsequently, intravenous infusion of Lexiscan was performed under the supervision of the Cardiology staff. At peak effect of the drug, 30 mCi Tc-29m tetrofosmin was injected intravenously and standard myocardial SPECT imaging was performed. Quantitative gated imaging was also performed to evaluate left ventricular wall motion, and estimate left ventricular ejection fraction. COMPARISON:  None. FINDINGS: Perfusion: A small area of reversibility is seen in the distal anteroseptal wall of the left ventricle, suspicious for myocardial ischemia. No other perfusion defects identified. Wall Motion: Normal left ventricular wall motion. No left ventricular dilation. Left Ventricular Ejection Fraction: 52 % End diastolic  volume 76 ml End systolic volume 36 ml IMPRESSION: 1. Small area of reversibility in the distal anteroseptal wall of the left ventricle, suspicious for inducible myocardial ischemia. 2. Normal left ventricular wall motion. 3. Left ventricular ejection fraction 52% 4. Non invasive risk stratification*: Low *2012 Appropriate Use Criteria for Coronary Revascularization Focused Update: J Am Coll Cardiol. 0131;43(8):887-579. http://content.[email or web address]?[email or web address] Electronically Signed   By: Earle Gell M.D.   On: 05/14/2018 14:22    Cardiac Studies     Patient Profile      Tara [name] is a 48 y.o. female with a hx of hypertension and diabetes who is being seen today for the evaluation of chest pain/pressure at the request of Dr. Tamala Julian  Assessment & Plan    1. Chest pain - Multiple poorly controlled CAD risk ractors, presents with chest pressure mixed in description - trops neg. EKG SR, inferior and lateral precordial TWIs - lexiscan small area of anteroseptal ischemia - echo LVEF 60-65%, moderate LVH, nondiagnostic diastolic function  -given poorly controlled CAD risk factors, symptoms concerning for unstable angina, abnormal EKG and stress test will plan for cath Monday.  - recurrent chest pressure off and on overnight, will plan NG patch and monitor symptoms.    2. Questionable LA density - there is a possible LA massed noted on TTE. I reviewed the images, nonspecific density noted in the parasternal long views as well as the parasternal short axis base view/AV view suggests a mass as well - will need TEE to further evaluate, pending timing and findings of cath could consider tomorrow if not then likely Tuesday.    I have reviewed the risks, indications, and alternatives to cardiac catheterization, possible angioplasty, and stenting with the patient today. Risks include but are not limited to bleeding, infection, vascular injury, stroke, myocardial infection,  arrhythmia, kidney injury, radiation-related injury in the case of prolonged fluoroscopy use, emergency cardiac surgery, and death. The patient understands the risks of serious complication is 1-2 in 7282 with diagnostic cardiac cath and 1-2% or less with angioplasty/stenting.    For questions or updates, please contact [place] Please consult [email or web address] for contact info under        Signed, Carlyle Dolly, MD  05/15/2018, 10:41 AM

## 2018-05-15 NOTE — Progress Notes (Signed)
ANTICOAGULATION CONSULT NOTE   Pharmacy Consult for Heparin Indication: chest pain/ACS  No Known Allergies  Patient Measurements: Height: 5\' 2"  (157.5 cm) Weight: 160 lb (72.6 kg) IBW/kg (Calculated) : 50.1 Heparin Dosing Weight: 65.6kg  Vital Signs: Temp: 97.8 F (36.6 C) (02/29 2150) Temp Source: Oral (02/29 2150) BP: 148/99 (02/29 2150) Pulse Rate: 86 (02/29 2150)  Labs: Recent Labs    05/13/18 1221 05/13/18 1656 05/13/18 2158 05/14/18 0414 05/14/18 2129 05/15/18 0400  HGB 14.6  --   --   --   --  13.2  HCT 44.4  --   --   --   --  38.8  PLT 419*  --   --   --   --  395  HEPARINUNFRC  --   --   --   --  0.35 0.25*  CREATININE 0.72  --   --   --   --   --   TROPONINI  --  <0.03 <0.03 <0.03  --   --     Estimated Creatinine Clearance: 82 mL/min (by C-G formula based on SCr of 0.72 mg/dL).   Medical History: Past Medical History:  Diagnosis Date  . Diabetes mellitus without complication ([place])   . Hypertension    Assessment: Pt is a 48 YO F being evaluated for CP/pressure. Pharmacy consulted to dose heparin. Pt not on AC PTA. Cath likely Monday.   Heparin level this AM subtherapeutic at 0.25 on 800 units/hr. Hgb trending down to 13.2, plts ok, no reports of bleeding or infusion related issues.  Goal of Therapy:  Heparin level 0.3-0.7 units/ml Monitor platelets by anticoagulation protocol: Yes   Plan:  Increase heparin gtt to 900 units/hr F/u HL in 6 hrs Monitor daily HL, s/sx bleeding, CBC   Juanell Fairly, PharmD PGY1 Pharmacy Resident Phone [phone] 05/15/2018 7:33 AM

## 2018-05-15 NOTE — Progress Notes (Signed)
ANTICOAGULATION CONSULT NOTE   Pharmacy Consult for Heparin Indication: chest pain/ACS  No Known Allergies  Patient Measurements: Height: 5\' 2"  (157.5 cm) Weight: 160 lb (72.6 kg) IBW/kg (Calculated) : 50.1 Heparin Dosing Weight: 65.6kg  Vital Signs: Temp: 98.2 F (36.8 C) (03/01 2047) Temp Source: Oral (03/01 2047) BP: 141/96 (03/01 2047) Pulse Rate: 76 (03/01 2047)  Labs: Recent Labs    05/13/18 1221  05/13/18 2158 05/14/18 0414  05/15/18 0400 05/15/18 0813 05/15/18 1302 05/15/18 2027  HGB 14.6  --   --   --   --  13.2  --   --   --   HCT 44.4  --   --   --   --  38.8  --   --   --   PLT 419*  --   --   --   --  395  --   --   --   HEPARINUNFRC  --   --   --   --    < > 0.25*  --  0.30 0.54  CREATININE 0.72  --   --   --   --   --   --   --   --   TROPONINI  --    < > <0.03 <0.03  --   --  <0.03  --   --    < > = values in this interval not displayed.    Estimated Creatinine Clearance: 82 mL/min (by C-G formula based on SCr of 0.72 mg/dL).   Medical History: Past Medical History:  Diagnosis Date  . Diabetes mellitus without complication ([place])   . Hypertension    Assessment: Pt is a 48 YO F being evaluated for CP/pressure. Pharmacy consulted to dose heparin. Pt not on AC PTA. Cath likely Monday.   Heparin level therapeutic at 0.54 on 950 units/hr. Hgb trending down to 13.2, plts ok, no reports of bleeding or infusion related issues.  Goal of Therapy:  Heparin level 0.3-0.7 units/ml Monitor platelets by anticoagulation protocol: Yes   Plan:  Continue heparin gtt at 950 units/hr Monitor daily HL, s/sx bleeding, CBC   Mikhayla Phillis A. Levada Dy, PharmD, [place] Pager: [phone] Please utilize Amion for appropriate phone number to reach the unit pharmacist ([place])   05/15/2018 9:12 PM

## 2018-05-15 NOTE — Progress Notes (Signed)
ANTICOAGULATION CONSULT NOTE   Pharmacy Consult for Heparin Indication: chest pain/ACS  No Known Allergies  Patient Measurements: Height: 5\' 2"  (157.5 cm) Weight: 160 lb (72.6 kg) IBW/kg (Calculated) : 50.1 Heparin Dosing Weight: 65.6kg  Vital Signs: Temp: 98 F (36.7 C) (03/01 1225) Temp Source: Oral (03/01 1225) BP: 151/99 (03/01 1225) Pulse Rate: 71 (03/01 1225)  Labs: Recent Labs    05/13/18 1221  05/13/18 2158 05/14/18 0414 05/14/18 2129 05/15/18 0400 05/15/18 0813 05/15/18 1302  HGB 14.6  --   --   --   --  13.2  --   --   HCT 44.4  --   --   --   --  38.8  --   --   PLT 419*  --   --   --   --  395  --   --   HEPARINUNFRC  --   --   --   --  0.35 0.25*  --  0.30  CREATININE 0.72  --   --   --   --   --   --   --   TROPONINI  --    < > <0.03 <0.03  --   --  <0.03  --    < > = values in this interval not displayed.    Estimated Creatinine Clearance: 82 mL/min (by C-G formula based on SCr of 0.72 mg/dL).   Medical History: Past Medical History:  Diagnosis Date  . Diabetes mellitus without complication ([place])   . Hypertension    Assessment: Pt is a 48 YO F being evaluated for CP/pressure. Pharmacy consulted to dose heparin. Pt not on AC PTA. Cath likely Monday.   Heparin level therapeutic at lower end of range at 0.3 on 900 units/hr. Hgb trending down to 13.2, plts ok, no reports of bleeding or infusion related issues.  Goal of Therapy:  Heparin level 0.3-0.7 units/ml Monitor platelets by anticoagulation protocol: Yes   Plan:  Increase heparin gtt to 950 units/hr F/u cHL in 6 hrs  Monitor daily HL, s/sx bleeding, CBC   Juanell Fairly, PharmD PGY1 Pharmacy Resident Phone [phone] 05/15/2018 1:47 PM

## 2018-05-15 NOTE — Progress Notes (Signed)
Inpatient Diabetes Program Recommendations  AACE/ADA: New Consensus Statement on Inpatient Glycemic Control   Target Ranges:  Prepandial:   less than 140 mg/dL      Peak postprandial:   less than 180 mg/dL (1-2 hours)      Critically ill patients:  140 - 180 mg/dL  Results for Tara [name], Tara [name] (MRN [id number]) as of 05/15/2018 11:43  Ref. Range 05/14/2018 07:32 05/14/2018 16:17 05/14/2018 21:57 05/15/2018 05:50 05/15/2018 11:15  Glucose-Capillary Latest Ref Range: 70 - 99 mg/dL 184 (H) 215 (H) 188 (H) 216 (H) 155 (H)   Results for Tara [name], Tara [name] (MRN [id number]) as of 05/15/2018 11:43  Ref. Range 05/13/2018 16:56  Hemoglobin A1C Latest Ref Range: 4.8 - 5.6 % 9.7 (H)   Review of Glycemic Control  Diabetes history: DM2 Outpatient Diabetes medications: Metformin XR 500 mg daily Current orders for Inpatient glycemic control: Lantus 5 units daily, Novolog 0-15 units TID with meals, Novolog 0-5 units QHS  NOTE: Noted consult for Diabetes Coordinator. Diabetes Coordinator is not on campus over the weekend but available by pager from 8am to 5pm for questions or concerns. Diabetes Coordinator will talk with patient on Monday 05/16/18 regarding glycemic control.  Thanks, Barnie Alderman, RN, MSN, CDE Diabetes Coordinator Inpatient Diabetes Program [phone] (Team Pager from 8am to 5pm)

## 2018-05-15 NOTE — H&P (View-Only) (Signed)
Progress Note  Patient Name: Tara [name] Date of Encounter: 05/15/2018  Primary Cardiologist: New  Subjective   Some chest pain overnight  Inpatient Medications    Scheduled Meds: . alum & mag hydroxide-simeth  30 mL Oral Once  . amLODipine  5 mg Oral QHS   And  . benazepril  20 mg Oral QHS  . aspirin EC  81 mg Oral Daily  . atorvastatin  40 mg Oral q1800  . insulin aspart  0-15 Units Subcutaneous TID WC  . insulin aspart  0-5 Units Subcutaneous QHS  . insulin glargine  5 Units Subcutaneous Daily  . metoprolol tartrate  25 mg Oral BID   Continuous Infusions: . heparin 900 Units/hr (05/15/18 0848)   PRN Meds: acetaminophen, morphine injection, nitroGLYCERIN, ondansetron (ZOFRAN) IV, zolpidem   Vital Signs    Vitals:   05/14/18 1142 05/14/18 1310 05/14/18 1828 05/14/18 2150  BP:  (!) 155/89 (!) 153/95 (!) 148/99  Pulse: 90 89 98 86  Resp:  18 18 18   Temp:  98.3 F (36.8 C) 98.6 F (37 C) 97.8 F (36.6 C)  TempSrc:  Oral Oral Oral  SpO2:  99% 98% 98%  Weight:      Height:        Intake/Output Summary (Last 24 hours) at 05/15/2018 1041 Last data filed at 05/15/2018 0400 Gross per 24 hour  Intake 976.46 ml  Output -  Net 976.46 ml   Last 3 Weights 05/13/2018  Weight (lbs) 160 lb  Weight (kg) 72.576 kg      Telemetry    NSR - Personally Reviewed  ECG    na  Physical Exam   GEN: No acute distress.   Neck: No JVD Cardiac: RRR, no murmurs, rubs, or gallops.  Respiratory: Clear to auscultation bilaterally. GI: Soft, nontender, non-distended  MS: No edema; No deformity. Neuro:  Nonfocal  Psych: Normal affect   Labs    Chemistry Recent Labs  Lab 05/13/18 1221  NA 136  K 3.9  CL 102  CO2 23  GLUCOSE 230*  BUN 10  CREATININE 0.72  CALCIUM 10.0  GFRNONAA >60  GFRAA >60  ANIONGAP 11     Hematology Recent Labs  Lab 05/13/18 1221 05/15/18 0400  WBC 9.1 9.1  RBC 4.88 4.33  HGB 14.6 13.2  HCT 44.4 38.8  MCV 91.0 89.6  MCH 29.9  30.5  MCHC 32.9 34.0  RDW 11.9 11.6  PLT 419* 395    Cardiac Enzymes Recent Labs  Lab 05/13/18 1656 05/13/18 2158 05/14/18 0414 05/15/18 0813  TROPONINI <0.03 <0.03 <0.03 <0.03    Recent Labs  Lab 05/13/18 1234  TROPIPOC 0.00     BNPNo results for input(s): BNP, PROBNP in the last 168 hours.   DDimer  Recent Labs  Lab 05/14/18 1257  DDIMER 0.33     Radiology    Dg Chest 2 View  Result Date: 05/13/2018 CLINICAL DATA:  Chest pain and hypertension. EXAM: CHEST - 2 VIEW COMPARISON:  03/19/2003 FINDINGS: The cardiac silhouette is upper limits of normal in size. The lungs are clear. No pleural effusion or pneumothorax is identified. No acute osseous abnormality is seen. IMPRESSION: No active cardiopulmonary disease. Electronically Signed   By: Logan Bores M.D.   On: 05/13/2018 13:57   Nm Myocar Multi W/spect W/wall Motion / Ef  Result Date: 05/14/2018 CLINICAL DATA:  Chest pain.  Diabetes and hypertension. EXAM: MYOCARDIAL IMAGING WITH SPECT (REST AND PHARMACOLOGIC-STRESS) GATED LEFT VENTRICULAR WALL MOTION  STUDY LEFT VENTRICULAR EJECTION FRACTION TECHNIQUE: Standard myocardial SPECT imaging was performed after resting intravenous injection of 10 mCi Tc-66m tetrofosmin. Subsequently, intravenous infusion of Lexiscan was performed under the supervision of the Cardiology staff. At peak effect of the drug, 30 mCi Tc-63m tetrofosmin was injected intravenously and standard myocardial SPECT imaging was performed. Quantitative gated imaging was also performed to evaluate left ventricular wall motion, and estimate left ventricular ejection fraction. COMPARISON:  None. FINDINGS: Perfusion: A small area of reversibility is seen in the distal anteroseptal wall of the left ventricle, suspicious for myocardial ischemia. No other perfusion defects identified. Wall Motion: Normal left ventricular wall motion. No left ventricular dilation. Left Ventricular Ejection Fraction: 52 % End diastolic  volume 76 ml End systolic volume 36 ml IMPRESSION: 1. Small area of reversibility in the distal anteroseptal wall of the left ventricle, suspicious for inducible myocardial ischemia. 2. Normal left ventricular wall motion. 3. Left ventricular ejection fraction 52% 4. Non invasive risk stratification*: Low *2012 Appropriate Use Criteria for Coronary Revascularization Focused Update: J Am Coll Cardiol. 9629;52(8):413-244. http://content.airportbarriers.com.aspx?articleid=[email or web address] Electronically Signed   By: Earle Gell M.D.   On: 05/14/2018 14:22    Cardiac Studies     Patient Profile      Tara [name] is a 48 y.o. female with a hx of hypertension and diabetes who is being seen today for the evaluation of chest pain/pressure at the request of Dr. Tamala Julian  Assessment & Plan    1. Chest pain - Multiple poorly controlled CAD risk ractors, presents with chest pressure mixed in description - trops neg. EKG SR, inferior and lateral precordial TWIs - lexiscan small area of anteroseptal ischemia - echo LVEF 60-65%, moderate LVH, nondiagnostic diastolic function  -given poorly controlled CAD risk factors, symptoms concerning for unstable angina, abnormal EKG and stress test will plan for cath Monday.  - recurrent chest pressure off and on overnight, will plan NG patch and monitor symptoms.    2. Questionable LA density - there is a possible LA massed noted on TTE. I reviewed the images, nonspecific density noted in the parasternal long views as well as the parasternal short axis base view/AV view suggests a mass as well - will need TEE to further evaluate, pending timing and findings of cath could consider tomorrow if not then likely Tuesday.    I have reviewed the risks, indications, and alternatives to cardiac catheterization, possible angioplasty, and stenting with the patient today. Risks include but are not limited to bleeding, infection, vascular injury, stroke, myocardial infection,  arrhythmia, kidney injury, radiation-related injury in the case of prolonged fluoroscopy use, emergency cardiac surgery, and death. The patient understands the risks of serious complication is 1-2 in 0102 with diagnostic cardiac cath and 1-2% or less with angioplasty/stenting.    For questions or updates, please contact [place] Please consult [email or web address] for contact info under        Signed, Carlyle Dolly, MD  05/15/2018, 10:41 AM

## 2018-05-16 ENCOUNTER — Encounter (HOSPITAL_COMMUNITY): Payer: Self-pay | Admitting: Cardiovascular Disease

## 2018-05-16 ENCOUNTER — Inpatient Hospital Stay (HOSPITAL_COMMUNITY): Payer: BC Managed Care – PPO

## 2018-05-16 ENCOUNTER — Inpatient Hospital Stay (HOSPITAL_COMMUNITY)
Admission: EM | Disposition: A | Discharge status: Home / Self Care | Payer: Self-pay | Source: Home / Self Care | Attending: Internal Medicine

## 2018-05-16 DIAGNOSIS — R9430 Abnormal result of cardiovascular function study, unspecified: Secondary | ICD-10-CM | POA: Diagnosis not present

## 2018-05-16 DIAGNOSIS — R072 Precordial pain: Secondary | ICD-10-CM

## 2018-05-16 DIAGNOSIS — R0789 Other chest pain: Secondary | ICD-10-CM

## 2018-05-16 DIAGNOSIS — I5189 Other ill-defined heart diseases: Secondary | ICD-10-CM

## 2018-05-16 DIAGNOSIS — I10 Essential (primary) hypertension: Secondary | ICD-10-CM

## 2018-05-16 HISTORY — PX: LEFT HEART CATH AND CORONARY ANGIOGRAPHY: CATH118249

## 2018-05-16 LAB — BASIC METABOLIC PANEL
Anion gap: 10 (ref 5–15)
BUN: 17 mg/dL (ref 6–20)
CO2: 21 mmol/L — ABNORMAL LOW (ref 22–32)
Calcium: 9.3 mg/dL (ref 8.9–10.3)
Chloride: 105 mmol/L (ref 98–111)
Creatinine, Ser: 0.72 mg/dL (ref 0.44–1.00)
GFR calc Af Amer: >60 mL/min (ref >60)
GFR calc non Af Amer: >60 mL/min (ref >60)
Glucose, Bld: 268 mg/dL — ABNORMAL HIGH (ref 70–99)
Potassium: 3.5 mmol/L (ref 3.5–5.1)
Sodium: 136 mmol/L (ref 135–145)

## 2018-05-16 LAB — CBC
HCT: 40.1 % (ref 36.0–46.0)
Hemoglobin: 13.6 g/dL (ref 12.0–15.0)
MCH: 30.5 pg (ref 26.0–34.0)
MCHC: 33.9 g/dL (ref 30.0–36.0)
MCV: 89.9 fL (ref 80.0–100.0)
Platelets: 417 10*3/uL — ABNORMAL HIGH (ref 150–400)
RBC: 4.46 MIL/uL (ref 3.87–5.11)
RDW: 11.6 % (ref 11.5–15.5)
WBC: 9.3 10*3/uL (ref 4.0–10.5)
nRBC: 0 % (ref 0.0–0.2)

## 2018-05-16 LAB — GLUCOSE, CAPILLARY
Glucose-Capillary: 232 mg/dL — ABNORMAL HIGH (ref 70–99)
Glucose-Capillary: 206 mg/dL — ABNORMAL HIGH (ref 70–99)
Glucose-Capillary: 154 mg/dL — ABNORMAL HIGH (ref 70–99)
Glucose-Capillary: 200 mg/dL — ABNORMAL HIGH (ref 70–99)

## 2018-05-16 LAB — HEPARIN LEVEL (UNFRACTIONATED): HEPARIN UNFRACTIONATED: 0.54 [IU]/mL (ref 0.30–0.70)

## 2018-05-16 SURGERY — LEFT HEART CATH AND CORONARY ANGIOGRAPHY
Anesthesia: LOCAL

## 2018-05-16 MED ORDER — IOHEXOL 350 MG/ML SOLN
INTRAVENOUS | Status: DC | PRN
  Administered 2018-05-16: 30 mL via INTRAVENOUS

## 2018-05-16 MED ORDER — FENTANYL CITRATE (PF) 100 MCG/2ML IJ SOLN
INTRAMUSCULAR | Status: DC | PRN
  Administered 2018-05-16: 25 ug via INTRAVENOUS

## 2018-05-16 MED ORDER — HEPARIN (PORCINE) IN NACL 1000-0.9 UT/500ML-% IV SOLN
INTRAVENOUS | Status: DC | PRN
  Administered 2018-05-16 (×2): 500 mL

## 2018-05-16 MED ORDER — SODIUM CHLORIDE 0.9% FLUSH: 3.0000 mL | INTRAVENOUS | Status: DC | PRN

## 2018-05-16 MED ORDER — HEPARIN SODIUM (PORCINE) 1000 UNIT/ML IJ SOLN
INTRAMUSCULAR | Status: DC | PRN
  Administered 2018-05-16: 3500 [IU] via INTRAVENOUS

## 2018-05-16 MED ORDER — HEPARIN (PORCINE) IN NACL 1000-0.9 UT/500ML-% IV SOLN
INTRAVENOUS | Status: AC
  Filled 2018-05-16: qty 2000

## 2018-05-16 MED ORDER — MIDAZOLAM HCL 2 MG/2ML IJ SOLN
INTRAMUSCULAR | Status: AC
  Filled 2018-05-16: qty 2

## 2018-05-16 MED ORDER — GADOBUTROL 1 MMOL/ML IV SOLN
7.5000 mL | Freq: Once | INTRAVENOUS | Status: AC | PRN
  Administered 2018-05-16: 7.5 mL via INTRAVENOUS

## 2018-05-16 MED ORDER — LIDOCAINE HCL (PF) 1 % IJ SOLN
INTRAMUSCULAR | Status: AC
  Filled 2018-05-16: qty 30

## 2018-05-16 MED ORDER — AMLODIPINE BESYLATE 5 MG PO TABS: 5.0000 mg | ORAL_TABLET | Freq: Every day | ORAL | Status: DC

## 2018-05-16 MED ORDER — LIDOCAINE HCL (PF) 1 % IJ SOLN
INTRAMUSCULAR | Status: DC | PRN
  Administered 2018-05-16: 2 mL

## 2018-05-16 MED ORDER — MIDAZOLAM HCL 2 MG/2ML IJ SOLN
INTRAMUSCULAR | Status: DC | PRN
  Administered 2018-05-16: 1 mg via INTRAVENOUS

## 2018-05-16 MED ORDER — SODIUM CHLORIDE 0.9% FLUSH
3.0000 mL | Freq: Two times a day (BID) | INTRAVENOUS | Status: DC
  Administered 2018-05-16 – 2018-05-17 (×3): 3 mL via INTRAVENOUS

## 2018-05-16 MED ORDER — SODIUM CHLORIDE 0.9 % IV SOLN: INTRAVENOUS | Status: AC

## 2018-05-16 MED ORDER — ASPIRIN 81 MG PO CHEW: 81.0000 mg | CHEWABLE_TABLET | Freq: Every day | ORAL | Status: DC

## 2018-05-16 MED ORDER — ASPIRIN 81 MG PO CHEW
CHEWABLE_TABLET | ORAL | Status: AC
  Filled 2018-05-16: qty 1

## 2018-05-16 MED ORDER — ACETAMINOPHEN 325 MG PO TABS: 650.0000 mg | ORAL_TABLET | ORAL | Status: DC | PRN

## 2018-05-16 MED ORDER — INSULIN GLARGINE 100 UNIT/ML ~~LOC~~ SOLN
10.0000 [IU] | Freq: Every day | SUBCUTANEOUS | Status: DC
  Administered 2018-05-16 (×2): 10 [IU] via SUBCUTANEOUS
  Filled 2018-05-16 (×2): qty 0.1

## 2018-05-16 MED ORDER — SODIUM CHLORIDE 0.9 % IV SOLN: 250.0000 mL | INTRAVENOUS | Status: DC | PRN

## 2018-05-16 MED ORDER — ONDANSETRON HCL 4 MG/2ML IJ SOLN: 4.0000 mg | Freq: Four times a day (QID) | INTRAMUSCULAR | Status: DC | PRN

## 2018-05-16 MED ORDER — VERAPAMIL HCL 2.5 MG/ML IV SOLN
INTRAVENOUS | Status: AC
  Filled 2018-05-16: qty 2

## 2018-05-16 MED ORDER — MORPHINE SULFATE (PF) 2 MG/ML IV SOLN: 2.0000 mg | INTRAVENOUS | Status: DC | PRN

## 2018-05-16 MED ORDER — FENTANYL CITRATE (PF) 100 MCG/2ML IJ SOLN
INTRAMUSCULAR | Status: AC
  Filled 2018-05-16: qty 2

## 2018-05-16 MED ORDER — NITROGLYCERIN 1 MG/10 ML FOR IR/CATH LAB
INTRA_ARTERIAL | Status: AC
  Filled 2018-05-16: qty 10

## 2018-05-16 SURGICAL SUPPLY — 10 items
CATH OPTITORQUE TIG 4.0 5F (CATHETERS) ×1 IMPLANT
DEVICE RAD COMP TR BAND LRG (VASCULAR PRODUCTS) ×2 IMPLANT
GLIDESHEATH SLEND A-KIT 6F 22G (SHEATH) ×1 IMPLANT
GUIDEWIRE INQWIRE 1.5J.035X260 (WIRE) ×1 IMPLANT
INQWIRE 1.5J .035X260CM (WIRE) ×2
KIT HEART LEFT (KITS) ×2 IMPLANT
PACK CARDIAC CATHETERIZATION (CUSTOM PROCEDURE TRAY) ×2 IMPLANT
TRANSDUCER W/STOPCOCK (MISCELLANEOUS) ×2 IMPLANT
TUBING CIL FLEX 10 FLL-RA (TUBING) ×2 IMPLANT
WIRE HI TORQ VERSACORE-J 145CM (WIRE) ×1 IMPLANT

## 2018-05-16 NOTE — Plan of Care (Signed)
RD consulted for Diabetes Nutrition Education.  RD provided "Carbohydrate Counting for Diabetes" and "Diabetes label reading tips" handout from the Academy of Nutrition and Dietetics. Reviewed patient's dietary recall.  Pt reported that she was wanting information regarding an overall healthy diet for a diabetic. On a normal day she has 2 meals and does not snack in between. Breakfast is often eggs, bacon, and coffee with creamer. Most often she does skip lunch. Dinner will be something easy such as deli sandwiches, some frozen meals. She mentioned that she has been feeling poorly recently and just not motivated to cook meals.  Reviewed different carbohydrate sources with patient and how they each impact blood sugar differently. Educated patient regarding whole grains and fiber. Reviewed the differences between non-starchy vegetables and starchy vegetables. Discussed the importance of having consistent amount of carbohydrates at each meal and not skipping meals or "saving carbohydrates".   Provided examples of snacks to include between meals. Discussed the importance of protein and fat with each meal. Teach back method used.  Expect fair to good compliance.  Current BMI: Body mass index is 29.27 kg/m. Pt meets criteria for overweight based on current BMI.  Current diet order is carb mod, patient is consuming approximately 100% of meals at this time.  Labs and medications reviewed. No further nutrition interventions warranted at this time.  RD contact information provided. If additional nutrition issues arise, please re-consult RD.   [place] Intern

## 2018-05-16 NOTE — Care Management (Signed)
#    11.  S/W DAVID  @ [facility] # [phone] OPT- MEMBER   VICTOZA INJECTIONS  1.2 MG SQ DAILY COVER- YES CO-PAY- $ 47.00 TIER- NO PRIOR APPROVAL- NO  PREFERRED PHARMACY : YES [facility]

## 2018-05-16 NOTE — Interval H&P Note (Signed)
Cath Lab Visit (complete for each Cath Lab visit)  Clinical Evaluation Leading to the Procedure:   ACS: Yes.    Non-ACS:    Anginal Classification: CCS III  Anti-ischemic medical therapy: No Therapy  Non-Invasive Test Results: No non-invasive testing performed  Prior CABG: No previous CABG      History and Physical Interval Note:  05/16/2018 7:33 AM  Tara [name]  has presented today for surgery, with the diagnosis of unstable angina  The various methods of treatment have been discussed with the patient and family. After consideration of risks, benefits and other options for treatment, the patient has consented to  Procedure(s): LEFT HEART CATH AND CORONARY ANGIOGRAPHY (N/A) as a surgical intervention .  The patient's history has been reviewed, patient examined, no change in status, stable for surgery.  I have reviewed the patient's chart and labs.  Questions were answered to the patient's satisfaction.     [name]

## 2018-05-16 NOTE — Progress Notes (Signed)
Progress Note  Patient Name: [name] Date of Encounter: 05/16/2018  Primary Cardiologist: new  Subjective   Seen post cath. Reviewed echo, cath findings. Patient is uncomfortable, some pain from TR band, pain at site of BP cuff. Discussed options for further workup, see below.  Inpatient Medications    Scheduled Meds: . alum & mag hydroxide-simeth  30 mL Oral Once  . amLODipine  5 mg Oral QHS   And  . benazepril  20 mg Oral QHS  . aspirin      . aspirin EC  81 mg Oral Daily  . atorvastatin  40 mg Oral q1800  . insulin aspart  0-15 Units Subcutaneous TID WC  . insulin aspart  0-5 Units Subcutaneous QHS  . insulin glargine  10 Units Subcutaneous Daily  . metoprolol tartrate  25 mg Oral BID  . nitroGLYCERIN  0.5 inch Topical Q6H  . sodium chloride flush  3 mL Intravenous Q12H   Continuous Infusions: . sodium chloride 75 mL/hr (05/16/18 0822)  . sodium chloride     PRN Meds: sodium chloride, acetaminophen, morphine injection, nitroGLYCERIN, ondansetron (ZOFRAN) IV, ondansetron (ZOFRAN) IV, sodium chloride flush, zolpidem   Vital Signs    Vitals:   05/16/18 0801 05/16/18 0850 05/16/18 0855 05/16/18 0900  BP: (!) 141/85 (!) 179/100 (!) 151/88 (!) 174/99  Pulse: 79 73 71 70  Resp: 13 17 12 15   Temp:      TempSrc:      SpO2: (!) 0% 99% 99% 98%  Weight:      Height:       No intake or output data in the 24 hours ending 05/16/18 1140 Last 3 Weights 05/13/2018  Weight (lbs) 160 lb  Weight (kg) 72.576 kg      Telemetry    NSR - Personally Reviewed  ECG    No new since 2/29 - Personally Reviewed  Physical Exam   GEN: No acute distress.   Neck: No JVD Cardiac: RRR, no murmurs, rubs, or gallops. Right radial TR band without hematoma, pulse intact Respiratory: Clear to auscultation bilaterally. GI: Soft, nontender, non-distended  MS: No edema; No deformity. Neuro:  Nonfocal  Psych: Normal affect   Labs    Chemistry Recent Labs  Lab 05/13/18 1221  05/16/18 0455  NA 136 136  K 3.9 3.5  CL 102 105  CO2 23 21*  GLUCOSE 230* 268*  BUN 10 17  CREATININE 0.72 0.72  CALCIUM 10.0 9.3  GFRNONAA >60 >60  GFRAA >60 >60  ANIONGAP 11 10     Hematology Recent Labs  Lab 05/13/18 1221 05/15/18 0400 05/16/18 0455  WBC 9.1 9.1 9.3  RBC 4.88 4.33 4.46  HGB 14.6 13.2 13.6  HCT 44.4 38.8 40.1  MCV 91.0 89.6 89.9  MCH 29.9 30.5 30.5  MCHC 32.9 34.0 33.9  RDW 11.9 11.6 11.6  PLT 419* 395 417*    Cardiac Enzymes Recent Labs  Lab 05/13/18 1656 05/13/18 2158 05/14/18 0414 05/15/18 0813  TROPONINI <0.03 <0.03 <0.03 <0.03    Recent Labs  Lab 05/13/18 1234  TROPIPOC 0.00     BNPNo results for input(s): BNP, PROBNP in the last 168 hours.   DDimer  Recent Labs  Lab 05/14/18 1257  DDIMER 0.33     Radiology    Nm Myocar Multi W/spect W/wall Motion / Ef  Result Date: 05/14/2018 CLINICAL DATA:  Chest pain.  Diabetes and hypertension. EXAM: MYOCARDIAL IMAGING WITH SPECT (REST AND PHARMACOLOGIC-STRESS) GATED LEFT VENTRICULAR  WALL MOTION STUDY LEFT VENTRICULAR EJECTION FRACTION TECHNIQUE: Standard myocardial SPECT imaging was performed after resting intravenous injection of 10 mCi Tc-105m tetrofosmin. Subsequently, intravenous infusion of Lexiscan was performed under the supervision of the Cardiology staff. At peak effect of the drug, 30 mCi Tc-66m tetrofosmin was injected intravenously and standard myocardial SPECT imaging was performed. Quantitative gated imaging was also performed to evaluate left ventricular wall motion, and estimate left ventricular ejection fraction. COMPARISON:  None. FINDINGS: Perfusion: A small area of reversibility is seen in the distal anteroseptal wall of the left ventricle, suspicious for myocardial ischemia. No other perfusion defects identified. Wall Motion: Normal left ventricular wall motion. No left ventricular dilation. Left Ventricular Ejection Fraction: 52 % End diastolic volume 76 ml End systolic  volume 36 ml IMPRESSION: 1. Small area of reversibility in the distal anteroseptal wall of the left ventricle, suspicious for inducible myocardial ischemia. 2. Normal left ventricular wall motion. 3. Left ventricular ejection fraction 52% 4. Non invasive risk stratification*: Low *2012 Appropriate Use Criteria for Coronary Revascularization Focused Update: J Am Coll Cardiol. 4163;84(5):364-680. http://content.[email or web address]?[email or web address] Electronically Signed   By: Earle Gell M.D.   On: 05/14/2018 14:22    Cardiac Studies   Echo 05/14/18--personally reviewed. Normal LV EF, no wall motion abnormalities. Moderate LV wall hypertrophy. Questionable LA density  Cath 05/16/18--personally reviewed. Normal coronaries, normal filling pressures (EDP 7)  Patient Profile     48 y.o. female with PMH hypertension, type II diabetes who presented with chest pressure concerning for unstable angina. Lexicsan with small area concerning for ischemia. LHC today revealed normal coronaries. Echo with possible LA mass.  Assessment & Plan    Chest pain/pressure: normal coronaries and normal filling pressures. Troponins negative x3. Lexiscan initially concerning for small area of ischemia, but with normal coronaries this may be false positive vs. Microvascular changes. -suspect noncardiac etiology  Possible left atrial mass on echo: personally reviewed images. There is echodensity in the left atrial on parasternal images. Not well seen on other images. Given this, discussed TEE vs. Cardiac MRI. With sedation/cath today, wish to avoid another invasive procedure. With normal Cr, will plan to proceed with cardiac MRI. She wishes to have done while inpatient -called MRI, they can do later today, does not need to be NPO -further evaluation TBD based on results of MRI  Hypertension: did not receive AM medications yet, and pain from BP cuff/TR band also driving her BP -recommend giving morning meds and  monitoring.  TIME SPENT WITH PATIENT: 25 minutes of direct patient care. More than 50% of that time was spent on coordination of care and counseling regarding test results, further evaluation of LA finding on echo.  Buford Dresser, MD, PhD [facility]  For questions or updates, please contact [place] Please consult [email or web address] for contact info under     Signed, Buford Dresser, MD  05/16/2018, 11:40 AM

## 2018-05-16 NOTE — Care Management (Addendum)
Benefits check submitted for Victoza 1.2 mg  daily to see what cost may be for patient outpatient. CM will make patient aware that she can go online for assistance with savings card. Diabetes Educator to see patient before she transitions home. No further needs from CM at this time. Bethena Roys, RN,BSN Case Manager [phone]

## 2018-05-16 NOTE — Progress Notes (Signed)
Inpatient Diabetes Program Recommendations  AACE/ADA: New Consensus Statement on Inpatient Glycemic Control (2015)  Target Ranges:  Prepandial:   less than 140 mg/dL      Peak postprandial:   less than 180 mg/dL (1-2 hours)      Critically ill patients:  140 - 180 mg/dL   Lab Results  Component Value Date   GLUCAP 206 (H) 05/16/2018   HGBA1C 9.7 (H) 05/13/2018    Review of Glycemic Control Results for Tara [name], Tara [name] (MRN [id number]) as of 05/16/2018 16:35  Ref. Range 05/15/2018 16:47 05/15/2018 20:52 05/16/2018 05:50 05/16/2018 08:48  Glucose-Capillary Latest Ref Range: 70 - 99 mg/dL 117 (H) 180 (H) 232 (H) 206 (H)   Diabetes history: Type 2 DM Outpatient Diabetes medications: Metformin 1000 mg QD Current orders for Inpatient glycemic control: Lantus 10 units QD, Novolog 0-15 units TID, Novolog 0-5 units QHS  Inpatient Diabetes Program Recommendations:    Spoke with patient regarding consult and home management of DM. Confirmed oral medications. Patient states, "I have been slacking on my diet and could do better."  Reviewed patient's current A1c of 9.7%. Explained what a A1c is and what it measures. Also reviewed goal A1c with patient, importance of good glucose control @ home, and blood sugar goals. Reviewed patho of DM, role of pancreas, survival skills, vascular changes and comorbidites.   Patient has a meter and testing supplies. Encourage to start checking 3-4 times per day.Stressed the importance of when to call MD, even if prior to appointment and to take meter to next visit which is scheduled this month. Patient seems very motivated to make lifestyle changes, check CBGs and plans to follow up with PCP. Dietitian to see patient.  Discussed insulin, properties of Victoza, brief review on performing injections and additional resources. At this time, patient is wanting to change lifestyle and avoid insulin if possible.  Patient has no further questions regarding DM at this time.  @1500 :  Discussed with RN why insulin was not being given and missing CBGs. Patient was unsure of last administration dose. However, patient received yesterday per MAR. Encouraged to go ahead and get CBG and given medications as ordered. @ 1630: Education officer, community about administering medications. Also, sent secure chat to RN to encourage administration.  Thanks, Bronson Curb, MSN, RNC-OB Diabetes Coordinator [phone] (8a-5p)

## 2018-05-16 NOTE — Progress Notes (Signed)
PROGRESS NOTE    KENZIE [name]  HXT:[id number] DOB: [birth year] DOA: 05/13/2018 PCP: Lawerance Cruel, MD   Brief Narrative:   Tara [name] is an 48 y.o. female with past medical history significant for hypertension and diabetes mellitus type 2; who presents complaints of chest pain.  Assessment & Plan:   Principal Problem:   Chest pain Active Problems:   Hypertension   Type 2 diabetes mellitus with hyperlipidemia (HCC)   Chest pain: Patient presented with left-sided and substernal chest pressure that was present without exertion.  Notes intermittent complaints of nausea, lightheadedness, and left arm achiness.  Pain improved with aspirin and completely resolved after nitroglycerin.  Heart score is 5.  Troponins negative x3.  EKG noted to be sinus rhythm with signs of LVH and T wave abnormality noted in the lateral leads.  Lipids revealed elevated LDL not at goal. D-dimer was negative.  Cardiology was consulted recommended stress test.stress test revealed a reversible area of ischemia in the distal aspect of the anteroseptal wall of the left ventricle.  She has now undergone cath on 3/2 which showed normal coronary arteries and normal filling pressures. - per cards, CP likely non cardiac - continue to monitor, w/u further as needed  Diabetes mellitus type 2: Uncontrolled.  Hemoglobin A1c 9.7.  Patient reports that she has not been very compliant with diet and had recently been started on metformin by her primary care provider. - She does not want to use insulin, will continue to discuss prior to discharge - Diabetes coordinator, appreciate recs - Carb modified diet - Hold metformin for now, uptitrate at discharge - CBG with moderate SSI - Will need continued outpatient follow-up  Hyperlipidemia: 05/14/2018 lipid panel revealed total cholesterol 291, HDL 61, triglycerides 54, LDL 219.  Goal less than 70.  Atorvastatin was started. -Continue atorvastatin 40 mg  Essential  hypertension: Uncontrolled.  Blood pressures still appear to be elevated. -Continue amlodipine, benazepril, and metoprolol - looks like AM meds not given, continue to monitor, will discuss with nursing  Questionable density of the left atrium: Questionable density seen only on parasternal view.  D-dimer was noted to be negative.  Cards recommending TEE vs cardiac MR. -cardiac MRI ordered for today  Insomnia: Patient reported difficulty sleeping overnight. -Ambien as needed for sleep  Body mass index is 29.26 kg/m.  The 10-year ASCVD risk score [name], et al., 2013) is: 26.1%   Values used to calculate the score:     Age: 72 years     Sex: Female     Is Non-Hispanic African American: Yes     Diabetic: Yes     Tobacco smoker: No     Systolic Blood Pressure: 165 mmHg     Is BP treated: Yes     HDL Cholesterol: 61 mg/dL     Total Cholesterol: 291 mg/dL  DVT prophylaxis: SCD Code Status:full  Family Communication: husband at bedsied Disposition Plan: pendign further imaging   Consultants:   cards  Procedures:  Echo IMPRESSIONS    1. The left ventricle has normal systolic function with an ejection fraction of 60-65%. The cavity size was normal. There is moderately increased left ventricular wall thickness. Left ventricular diastology could not be evaluated due to nondiagnostic  images. The average left ventricular global longitudinal strain is -19.8 %.  2. The right ventricle has normal systolic function. The cavity was normal. There is no increase in right ventricular wall thickness.  3. The tricuspid valve is  normal in structure.  4. The pulmonic valve was normal in structure.  5. The average left ventricular global longitudinal strain is -19.8 %.  6. The mitral valve is normal in structure.  7. The aortic valve is normal in structure.  8. Questionable density in the LA but only seen in the parasternal short axis view. Consider Cardiac CTA vs. TEE for better  elucidation.  CARDIAC CATHETERIZATION     History obtained from chart review.  Tara [name] is a 48 year old married African-American female admitted with unstable angina.  She does have diabetes and hypertension poorly controlled.  She does not smoke nor is there a family history.  She ruled out for myocardial infarction with negative enzymes.  EKG did did show LVH with repolarization changes and her 2D echo revealed normal LV systolic function with a question of a left atrial mass.  She had a low risk Myoview.  She presents now for diagnostic coronary angiography to rule out ischemia.  IMPRESSION: Ms. Harvell has normal coronary arteries and normal filling pressures.  I believe her chest pain is noncardiac.  She will still need further evaluation of her left atrial mass either by cardiac MRI or transesophageal echo.  The sheath was removed and a TR band was placed on the right wrist to achieve patent hemostasis.  The patient left the lab in stable condition.  Antimicrobials:  Anti-infectives (From admission, onward)   None      Subjective: No CP at the moment. Has come/gone. Discussed importance of HTN/DM control in follow up.  Objective: Vitals:   05/16/18 0850 05/16/18 0855 05/16/18 0900 05/16/18 1300  BP: (!) 179/100 (!) 151/88 (!) 174/99   Pulse: 73 71 70   Resp: 17 12 15    Temp:    98.7 F (37.1 C)  TempSrc:    Oral  SpO2: 99% 99% 98%   Weight:    72.6 kg  Height:    5\' 2"  (1.575 m)   No intake or output data in the 24 hours ending 05/16/18 1727 Filed Weights   05/13/18 1209 05/16/18 1300  Weight: 72.6 kg 72.6 kg    Examination:  General exam: Appears calm and comfortable  Respiratory system: Clear to auscultation. Respiratory effort normal. Cardiovascular system: S1 & S2 heard, RRR.  Gastrointestinal system: Abdomen is nondistended, soft and nontender.  Central nervous system: Alert and oriented. No focal neurological deficits. Skin: No rashes, lesions or  ulcers Psychiatry: Judgement and insight appear normal. Mood & affect appropriate.     Data Reviewed: I have personally reviewed following labs and imaging studies  CBC: Recent Labs  Lab 05/13/18 1221 05/15/18 0400 05/16/18 0455  WBC 9.1 9.1 9.3  HGB 14.6 13.2 13.6  HCT 44.4 38.8 40.1  MCV 91.0 89.6 89.9  PLT 419* 395 734*   Basic Metabolic Panel: Recent Labs  Lab 05/13/18 1221 05/16/18 0455  NA 136 136  K 3.9 3.5  CL 102 105  CO2 23 21*  GLUCOSE 230* 268*  BUN 10 17  CREATININE 0.72 0.72  CALCIUM 10.0 9.3   GFR: Estimated Creatinine Clearance: 82 mL/min (by C-G formula based on SCr of 0.72 mg/dL). Liver Function Tests: No results for input(s): AST, ALT, ALKPHOS, BILITOT, PROT, ALBUMIN in the last 168 hours. No results for input(s): LIPASE, AMYLASE in the last 168 hours. No results for input(s): AMMONIA in the last 168 hours. Coagulation Profile: No results for input(s): INR, PROTIME in the last 168 hours. Cardiac Enzymes: Recent Labs  Lab  05/13/18 1656 05/13/18 2158 05/14/18 0414 05/15/18 0813  TROPONINI <0.03 <0.03 <0.03 <0.03   BNP (last 3 results) No results for input(s): PROBNP in the last 8760 hours. HbA1C: No results for input(s): HGBA1C in the last 72 hours. CBG: Recent Labs  Lab 05/15/18 1647 05/15/18 2052 05/16/18 0550 05/16/18 0848 05/16/18 1703  GLUCAP 117* 180* 232* 206* 154*   Lipid Profile: Recent Labs    05/14/18 0414  CHOL 291*  HDL 61  LDLCALC 219*  TRIG 54  CHOLHDL 4.8   Thyroid Function Tests: No results for input(s): TSH, T4TOTAL, FREET4, T3FREE, THYROIDAB in the last 72 hours. Anemia Panel: No results for input(s): VITAMINB12, FOLATE, FERRITIN, TIBC, IRON, RETICCTPCT in the last 72 hours. Sepsis Labs: No results for input(s): PROCALCITON, LATICACIDVEN in the last 168 hours.  No results found for this or any previous visit (from the past 240 hour(s)).       Radiology Studies: No results  found.      Scheduled Meds: . alum & mag hydroxide-simeth  30 mL Oral Once  . amLODipine  5 mg Oral QHS   And  . benazepril  20 mg Oral QHS  . aspirin      . aspirin EC  81 mg Oral Daily  . atorvastatin  40 mg Oral q1800  . insulin aspart  0-15 Units Subcutaneous TID WC  . insulin aspart  0-5 Units Subcutaneous QHS  . insulin glargine  10 Units Subcutaneous Daily  . metoprolol tartrate  25 mg Oral BID  . nitroGLYCERIN  0.5 inch Topical Q6H  . sodium chloride flush  3 mL Intravenous Q12H   Continuous Infusions: . sodium chloride       LOS: 1 day    Time spent: over 30 min    Fayrene Helper, MD [facility] Pager AMION  If 7PM-7AM, please contact night-coverage [email or web address] Password [email or web address] 05/16/2018, 5:27 PM

## 2018-05-17 ENCOUNTER — Telehealth: Payer: Self-pay | Admitting: Cardiology

## 2018-05-17 LAB — CBC
HEMATOCRIT: 38.8 % (ref 36.0–46.0)
Hemoglobin: 13.5 g/dL (ref 12.0–15.0)
MCH: 31 pg (ref 26.0–34.0)
MCHC: 34.8 g/dL (ref 30.0–36.0)
MCV: 89.2 fL (ref 80.0–100.0)
Platelets: 363 10*3/uL (ref 150–400)
RBC: 4.35 MIL/uL (ref 3.87–5.11)
RDW: 11.6 % (ref 11.5–15.5)
WBC: 8.3 10*3/uL (ref 4.0–10.5)
nRBC: 0 % (ref 0.0–0.2)

## 2018-05-17 LAB — BASIC METABOLIC PANEL
Anion gap: 10 (ref 5–15)
BUN: 16 mg/dL (ref 6–20)
CO2: 22 mmol/L (ref 22–32)
Calcium: 9.2 mg/dL (ref 8.9–10.3)
Chloride: 105 mmol/L (ref 98–111)
Creatinine, Ser: 0.94 mg/dL (ref 0.44–1.00)
GFR calc Af Amer: >60 mL/min (ref >60)
GFR calc non Af Amer: >60 mL/min (ref >60)
Glucose, Bld: 164 mg/dL — ABNORMAL HIGH (ref 70–99)
Potassium: 3.7 mmol/L (ref 3.5–5.1)
Sodium: 137 mmol/L (ref 135–145)

## 2018-05-17 LAB — GLUCOSE, CAPILLARY: Glucose-Capillary: 157 mg/dL — ABNORMAL HIGH (ref 70–99)

## 2018-05-17 MED ORDER — DULAGLUTIDE 0.75 MG/0.5ML ~~LOC~~ SOAJ: 0.7500 mg | SUBCUTANEOUS | 0 refills | Status: AC

## 2018-05-17 MED ORDER — BLOOD GLUCOSE MONITOR KIT: PACK | 0 refills | Status: AC

## 2018-05-17 MED ORDER — METFORMIN HCL ER 500 MG PO TB24: 500.0000 mg | ORAL_TABLET | Freq: Two times a day (BID) | ORAL | 0 refills | Status: AC

## 2018-05-17 MED ORDER — METOPROLOL TARTRATE 25 MG PO TABS: 25.0000 mg | ORAL_TABLET | Freq: Two times a day (BID) | ORAL | 0 refills | Status: DC

## 2018-05-17 MED ORDER — ATORVASTATIN CALCIUM 40 MG PO TABS: 40.0000 mg | ORAL_TABLET | Freq: Every day | ORAL | 0 refills | Status: DC

## 2018-05-17 MED FILL — Verapamil HCl IV Soln 2.5 MG/ML: INTRAVENOUS | Qty: 2 | Status: AC

## 2018-05-17 MED FILL — Nitroglycerin IV Soln 100 MCG/ML in D5W: INTRA_ARTERIAL | Qty: 10 | Status: AC

## 2018-05-17 NOTE — Telephone Encounter (Signed)
Patient called with results of cardiac MRI. There is NO left atrial mass seen on imaging. She appreciated call, no questions.  Buford Dresser, MD, PhD [facility]  [address] [place], [place] [address] [phone]

## 2018-05-17 NOTE — Progress Notes (Signed)
Progress Note  Patient Name: Tara [name] Date of Encounter: 05/17/2018  Primary Cardiologist: Dr. Harrell Gave (new)  Subjective   MRI done yesterday, awaiting read. Overall doing well. Radial cath site mildly sore. Had questions about medications, discussed.  Inpatient Medications    Scheduled Meds: . alum & mag hydroxide-simeth  30 mL Oral Once  . amLODipine  5 mg Oral QHS   And  . benazepril  20 mg Oral QHS  . aspirin EC  81 mg Oral Daily  . atorvastatin  40 mg Oral q1800  . insulin aspart  0-15 Units Subcutaneous TID WC  . insulin aspart  0-5 Units Subcutaneous QHS  . insulin glargine  10 Units Subcutaneous Daily  . metoprolol tartrate  25 mg Oral BID  . sodium chloride flush  3 mL Intravenous Q12H   Continuous Infusions: . sodium chloride     PRN Meds: sodium chloride, acetaminophen, morphine injection, nitroGLYCERIN, ondansetron (ZOFRAN) IV, ondansetron (ZOFRAN) IV, sodium chloride flush, zolpidem   Vital Signs    Vitals:   05/16/18 2100 05/16/18 2328 05/17/18 0515 05/17/18 0930  BP: 135/82  136/81 140/73  Pulse: 72 84 69 76  Resp:      Temp: (!) 97.3 F (36.3 C)  98 F (36.7 C)   TempSrc: Oral  Oral   SpO2: 100%  99%   Weight:   71.1 kg   Height:        Intake/Output Summary (Last 24 hours) at 05/17/2018 1104 Last data filed at 05/16/2018 2335 Gross per 24 hour  Intake 243 ml  Output -  Net 243 ml   Last 3 Weights 05/17/2018 05/16/2018 05/13/2018  Weight (lbs) 156 lb 12.8 oz 160 lb 0.9 oz 160 lb  Weight (kg) 71.124 kg 72.6 kg 72.576 kg      Telemetry    NSR - Personally Reviewed  ECG    No new since 2/29 - Personally Reviewed  Physical Exam   GEN: No acute distress.   Neck: No JVD Cardiac: RRR, no murmurs, rubs, or gallops. Right radial cath site without hematoma, pulse intact Respiratory: Clear to auscultation bilaterally. GI: Soft, nontender, non-distended  MS: No edema; No deformity. Neuro:  Nonfocal  Psych: Normal affect   Labs    Chemistry Recent Labs  Lab 05/13/18 1221 05/16/18 0455 05/17/18 0329  NA 136 136 137  K 3.9 3.5 3.7  CL 102 105 105  CO2 23 21* 22  GLUCOSE 230* 268* 164*  BUN 10 17 16   CREATININE 0.72 0.72 0.94  CALCIUM 10.0 9.3 9.2  GFRNONAA >60 >60 >60  GFRAA >60 >60 >60  ANIONGAP 11 10 10      Hematology Recent Labs  Lab 05/15/18 0400 05/16/18 0455 05/17/18 0329  WBC 9.1 9.3 8.3  RBC 4.33 4.46 4.35  HGB 13.2 13.6 13.5  HCT 38.8 40.1 38.8  MCV 89.6 89.9 89.2  MCH 30.5 30.5 31.0  MCHC 34.0 33.9 34.8  RDW 11.6 11.6 11.6  PLT 395 417* 363    Cardiac Enzymes Recent Labs  Lab 05/13/18 1656 05/13/18 2158 05/14/18 0414 05/15/18 0813  TROPONINI <0.03 <0.03 <0.03 <0.03    Recent Labs  Lab 05/13/18 1234  TROPIPOC 0.00     BNPNo results for input(s): BNP, PROBNP in the last 168 hours.   DDimer  Recent Labs  Lab 05/14/18 1257  DDIMER 0.33     Radiology    No results found.  Cardiac Studies   Echo 05/14/18--personally reviewed. Normal LV EF,  no wall motion abnormalities. Moderate LV wall hypertrophy. Questionable LA density  Cath 05/16/18--personally reviewed. Normal coronaries, normal filling pressures (EDP 7)  Patient Profile     48 y.o. female with PMH hypertension, type II diabetes who presented with chest pressure concerning for unstable angina. Lexicsan with small area concerning for ischemia. LHC today revealed normal coronaries. Echo with possible LA mass.  Assessment & Plan    Chest pain/pressure: normal coronaries and normal filling pressures. Troponins negative x3. Lexiscan initially concerning for small area of ischemia, but with normal coronaries this may be false positive vs. Microvascular changes. -suspect noncardiac etiology -given diabetes, reasonable to continue aspirin and atorvastatin for prevention  Possible left atrial mass on echo: personally reviewed echo images. There is echodensity in the left atrial on parasternal images. Had cardiac MRI  late yesterday. I personally reviewed images, but awaiting final read.  -can leave before final read, will call with results.  Hypertension: better controlled today. Reviewed how to log/monitor home Bps at length. Recommend she bring this log to PCP follow up visit to see if her medication doses need to be increased. -on amlodipine 5 mg-benazepril 20 mg daily, may need uptitration based on home numbers  [facility] will sign off.   Medication Recommendations:  Aspirin, atorvastatin, amlodipine-benazepril as noted Other recommendations (labs, testing, etc):  none Follow up as an outpatient:  We will arrange for follow up at the [facility] office. If MRI not read by discharge, we will call with results.  TIME SPENT WITH PATIENT: 15 minutes of direct patient care. More than 50% of that time was spent on coordination of care and counseling regarding plans for follow up, blood pressure discussion.  Buford Dresser, MD, PhD [facility]  For questions or updates, please contact [place] Please consult [email or web address] for contact info under     Signed, Buford Dresser, MD  05/17/2018, 11:04 AM

## 2018-05-18 NOTE — Discharge Summary (Addendum)
Physician Discharge Summary  Tara [name] KGM:[id number] DOB: June 03, 1970 DOA: 05/13/2018  PCP: Lawerance Cruel, MD  Admit date: 05/13/2018 Discharge date: 05/18/2018  Time spent: 40 minutes  Recommendations for Outpatient Follow-up:  1. Follow up outpatient CBC/CMP 2. Follow up with cardiology outpatient  3. Follow outpatient diabetes regimen.  Started on trulicity.  Instructed to uptitrate metformin.  Discharge Diagnoses:  Principal Problem:   Chest pain Active Problems:   Hypertension   Type 2 diabetes mellitus with hyperlipidemia [name])   Discharge Condition: stable  Diet recommendation: heart healthy  Filed Weights   05/13/18 1209 05/16/18 1300 05/17/18 0515  Weight: 72.6 kg 72.6 kg 71.1 kg    History of present illness:  Tara R Gravesis an 48 y.o.femalewith past medical history significant for hypertension and diabetes mellitus type 2; who presents complaints of chest pain.  She was seen for chest pain.  She had negative troponins and low risk stress test.  She had cath which showed normal coronary arteries.  She had echo notable for possible L atrial mass, but cardiac MRI was negative.  See below for additional details  Hospital Course:  Chest pain: Patient presented with left-sided and substernal chest pressure that was present without exertion. Notes intermittent complaints of nausea, lightheadedness, and left arm achiness. Pain improved with aspirin and completely resolved after nitroglycerin. Heart score is 5. Troponins negative x3. EKG noted to be sinus rhythm with signs of LVH and T wave abnormality noted in the lateral leads. Lipids revealed elevated LDL not at goal. D-dimerwas negative.Cardiology was consulted recommended stress test.stress test revealed areversible area of ischemia in the distal aspect of the anteroseptal wall of the left ventricle.She has now undergone cath on 3/2 which showed normal coronary arteries and normal filling  pressures. - per cards, CP likely non cardiac - continue to monitor, w/u further as needed  Diabetes mellitus type 2: Uncontrolled. Hemoglobin A1c 9.7. Patient reports that she has not been very compliant with diet and had recently been started on metformin by her primary care provider. - She does not want to use insulin - will discharge with trulicity (discussed risk of thyroid cancer - she denies hx of thyroid cancer and fh of same) - discharge with metofromin and uptitrate as tolerated - Will need continued outpatient follow-up  Hyperlipidemia: 05/14/2018 lipid panel revealed total cholesterol 291, HDL 61, triglycerides 54, LDL 219. Goal less than 70.Atorvastatin was started. -Continueatorvastatin 40 mg  Essential hypertension: improved today, follow outpatient -Continue amlodipine,benazepril, and metoprolol  Questionable density of the left atrium: Questionable density seen only on parasternal view. D-dimer was noted to be negative. Cards recommending TEE vs cardiac MR. -cardiac MRI negative for LA mass  Insomnia: Patient reported difficulty sleeping overnight. -Ambien as needed for sleep  Body mass index is 29.26 kg/m.  The 10-year ASCVD risk score Tara [name]) is: 26.1%   Values used to calculate the score:     Age: 59 years     Sex: Female     Is Non-Hispanic African American: Yes     Diabetic: Yes     Tobacco smoker: No     Systolic Blood Pressure: 644 mmHg     Is BP treated: Yes     HDL Cholesterol: 61 mg/dL     Total Cholesterol: 291 mg/dL  Procedures: Echo IMPRESSIONS   1. The left ventricle has normal systolic function with an ejection fraction of 60-65%. The cavity size was normal. There is moderately increased  left ventricular wall thickness. Left ventricular diastology could not be evaluated due to nondiagnostic  images. The average left ventricular global longitudinal strain is -19.8 %. 2. The right ventricle has normal  systolic function. The cavity was normal. There is no increase in right ventricular wall thickness. 3. The tricuspid valve is normal in structure. 4. The pulmonic valve was normal in structure. 5. The average left ventricular global longitudinal strain is -19.8 %. 6. The mitral valve is normal in structure. 7. The aortic valve is normal in structure. 8. Questionable density in the LA but only seen in the parasternal short axis view. Consider Cardiac CTA vs. TEE for better elucidation.  CARDIAC CATHETERIZATION    History obtained from chart review. Tara [name] is Tara [name] 48 year old married African-American female admitted with unstable angina. She does have diabetes and hypertension poorly controlled. She does not smoke nor is there Tara [name] family history. She ruled out for myocardial infarction with negative enzymes. EKG did did show LVH with repolarization changes and her 2D echo revealed normal LV systolic function with Tara [name] question of Tara [name] left atrial mass. She had Tara [name] low risk Myoview. She presents now for diagnostic coronary angiography to rule out ischemia.  IMPRESSION:Tara [name] has normal coronary arteries and normal filling pressures. I believe her chest pain is noncardiac. She will still need further evaluation of her left atrial mass either by cardiac MRI or transesophageal echo. The sheath was removed and Tara [name] TR band was placed on the right wrist to achieve patent hemostasis. The patient left the lab in stable condition.  Consultations:  cards  Discharge Exam: Vitals:   05/17/18 0515 05/17/18 0930  BP: 136/81 140/73  Pulse: 69 76  Resp:    Temp: 98 F (36.7 C)   SpO2: 99%    Doing well Tara [name] at bedside Discuss d/c plan.  General: No acute distress. Cardiovascular: Heart sounds show Tara [name] regular rate, and rhythm. Lungs: Clear to auscultation bilaterally  Abdomen: Soft, nontender, nondistended  Neurological: Alert and oriented 3. Moves all extremities 4 with equal  strength. Cranial nerves II through XII grossly intact. Skin: Warm and dry. No rashes or lesions. Extremities: No clubbing or cyanosis. No edema.*.  Discharge Instructions   Discharge Instructions    Ambulatory referral to Nutrition and Diabetic Education   Complete by:  As directed    Call MD for:  difficulty breathing, headache or visual disturbances   Complete by:  As directed    Call MD for:  extreme fatigue   Complete by:  As directed    Call MD for:  hives   Complete by:  As directed    Call MD for:  persistant dizziness or light-headedness   Complete by:  As directed    Call MD for:  persistant nausea and vomiting   Complete by:  As directed    Call MD for:  redness, tenderness, or signs of infection (pain, swelling, redness, odor or green/yellow discharge around incision site)   Complete by:  As directed    Call MD for:  severe uncontrolled pain   Complete by:  As directed    Call MD for:  temperature >100.4   Complete by:  As directed    Diet - low sodium heart healthy   Complete by:  As directed    Discharge instructions   Complete by:  As directed    You were seen for chest pain.  You had Alexsander Cavins catheterization which was not suggestive of Arlis Yale cardiac cause of  your pain.  You had an MRI to further evaluate your abnormal echo.  You should get Corita Allinson phone call from cardiology regarding these results.  If you don't hear about an appointment or your results in Shamanda Len few days, please call the office.  Please follow up with your primary care doctor for your diabetes.  We've started you on trulicity here.  That's Winnona Wargo once Kizer Nobbe week injection.  Please gradually increase your metformin to 500 mg twice Laretha Luepke day (after tolerating 500 mg daily for about 1 week, increase to twice daily).  Please follow up with Dr. Harrington Challenger for further dose adjustment and titration of your diabetes medications.  Check your blood sugar 3-4 times Latalia Etzler day.  Keep Murel Wigle log of this and bring this to your next PCP appointment.  We're using  trulicity to try to avoid daily insulin injections, but if this does not control your blood sugar well enough, you may eventually need to transition to insulin.   Return for new, recurrent, or worsening symptoms.  Please ask your PCP to request records from this hospitalization so they know what was done and what the next steps will be.   Increase activity slowly   Complete by:  As directed      Allergies as of 05/17/2018   No Known Allergies     Medication List    TAKE these medications   amLODipine-benazepril 5-20 MG capsule Commonly known as:  LOTREL Take 1 capsule by mouth daily.   aspirin EC 81 MG tablet Take 81 mg by mouth once.   atorvastatin 40 MG tablet Commonly known as:  LIPITOR Take 1 tablet (40 mg total) by mouth daily at 6 PM for 30 days.   BIOTIN 5000 PO Take 1 tablet by mouth daily.   blood glucose meter kit and supplies Kit Dispense based on patient and insurance preference. Use up to four times daily as directed. (FOR ICD-9 250.00, 250.01).   Dulaglutide 0.75 MG/0.5ML Sopn Commonly known as:  TRULICITY Inject 0.97 mg into the skin once Peyton Spengler week for 30 days.   Magnesium 250 MG Tabs Take 250 mg by mouth daily.   metFORMIN 500 MG 24 hr tablet Commonly known as:  GLUCOPHAGE-XR Take 1 tablet (500 mg total) by mouth 2 (two) times daily for 30 days. (gradually increase as tolerated as instructed in AVS) What changed:    when to take this  additional instructions   metoprolol tartrate 25 MG tablet Commonly known as:  LOPRESSOR Take 1 tablet (25 mg total) by mouth 2 (two) times daily for 30 days.   multivitamin with minerals tablet Take 1 tablet by mouth daily.      No Known Allergies Follow-up Information    Lawerance Cruel, MD Follow up.   Specialty:  Family Medicine Contact information: [place] [address] [phone]        Buford Dresser, MD. Go to.   Specialty:  Cardiology Why:  You have [facility] Dem[facility] hospital  follow-up scheduled for 05/25/2018 at 8:20am. Please arrive 15 minutes early for check-in.  Contact information: [address] [place] [address] [phone]            The results of significant diagnostics from this hospitalization (including imaging, microbiology, ancillary and laboratory) are listed below for reference.    Significant Diagnostic Studies: Dg Chest 2 View  Result Date: 05/13/2018 CLINICAL DATA:  Chest pain and hypertension. EXAM: CHEST - 2 VIEW COMPARISON:  03/19/2003 FINDINGS: The cardiac  silhouette is upper limits of normal in size. The lungs are clear. No pleural effusion or pneumothorax is identified. No acute osseous abnormality is seen. IMPRESSION: No active cardiopulmonary disease. Electronically Signed   By: Logan Bores M.D.   On: 05/13/2018 13:57   Nm Myocar Multi W/spect W/wall Motion / Ef  Result Date: 05/14/2018 CLINICAL DATA:  Chest pain.  Diabetes and hypertension. EXAM: MYOCARDIAL IMAGING WITH SPECT (REST AND PHARMACOLOGIC-STRESS) GATED LEFT VENTRICULAR WALL MOTION STUDY LEFT VENTRICULAR EJECTION FRACTION TECHNIQUE: Standard myocardial SPECT imaging was performed after resting intravenous injection of 10 mCi Tc-18mtetrofosmin. Subsequently, intravenous infusion of Lexiscan was performed under the supervision of the Cardiology staff. At peak effect of the drug, 30 mCi Tc-957metrofosmin was injected intravenously and standard myocardial SPECT imaging was performed. Quantitative gated imaging was also performed to evaluate left ventricular wall motion, and estimate left ventricular ejection fraction. COMPARISON:  None. FINDINGS: Perfusion: Nocholas Damaso small area of reversibility is seen in the distal anteroseptal wall of the left ventricle, suspicious for myocardial ischemia. No other perfusion defects identified. Wall Motion: Normal left ventricular wall motion. No left ventricular dilation. Left Ventricular Ejection Fraction: 52 % End diastolic volume 76  ml End systolic volume 36 ml IMPRESSION: 1. Small area of reversibility in the distal anteroseptal wall of the left ventricle, suspicious for inducible myocardial ischemia. 2. Normal left ventricular wall motion. 3. Left ventricular ejection fraction 52% 4. Non invasive risk stratification*: Low *2012 Appropriate Use Criteria for Coronary Revascularization Focused Update: J Am Coll Cardiol. 202585;27(7):[email or web address]?articleid=[email or web address] Electronically Signed   By: JoEarle Gell.D.   On: 05/14/2018 14:22   Mr Cardiac Morphology W Wo Contrast  Result Date: 05/17/2018 CLINICAL DATA:  [age]ear old female with suspicion for Brynden Thune left atrial mass on an echocardiogram. EXAM: CARDIAC MRI TECHNIQUE: The patient was scanned on Abriella Filkins 1.5 Tesla GE magnet. Vu Liebman dedicated cardiac coil was used. Functional imaging was done using Fiesta sequences. 2,3, and 4 chamber views were done to assess for RWMA's. Modified Simpson's rule using Milah Recht short axis stack was used to calculate an ejection fraction on Tudor Chandley dedicated work stConservation officer, natureThe patient received 7 cc of Gadavist. After 10 minutes inversion recovery sequences were used to assess for infiltration and scar tissue. CONTRAST:  7 cc  of Gadavist FINDINGS: 1. Normal left ventricular size with moderate concentric hypertrophy and normal systolic function (LVEF = 67%). There are no regional wall motion abnormalities. There is no late gadolinium enhancement in the left ventricular myocardium. LVEDD: 45 mm LVESD: 23 mm LVEDV: 96 ml LVESV: 31 ml SV: 65 ml CO: 4.5 L/min Myocardial mass: 141 g 2. Normal right ventricular size, thickness and systolic function (LVEF = 57%). There are no regional wall motion abnormalities. 3.  Normal left and right atrial size. 4. Normal size of the aortic root, ascending aorta and pulmonary artery. 5.  Trivial mitral regurgitation. 6.  Normal pericardium.  No pericardial effusion. IMPRESSION: 1. Normal left ventricular  size with moderate concentric hypertrophy and normal systolic function (LVEF = 67%). There are no regional wall motion abnormalities. There is no late gadolinium enhancement in the left ventricular myocardium. 2. Normal right ventricular size, thickness and systolic function (LVEF = 57%). There are no regional wall motion abnormalities. 3.  Normal left and right atrial size. 4. Normal size of the aortic root, ascending aorta and pulmonary artery. 5. Trivial mitral regurgitation. 6.  Normal pericardium.  No pericardial effusion. There is no evidence for Leonila Speranza left atrial mass.  Electronically Signed   By: Ena Dawley   On: 05/17/2018 12:09    Microbiology: No results found for this or any previous visit (from the past 240 hour(s)).   Labs: Basic Metabolic Panel: Recent Labs  Lab 05/13/18 1221 05/16/18 0455 05/17/18 0329  NA 136 136 137  K 3.9 3.5 3.7  CL 102 105 105  CO2 23 21* 22  GLUCOSE 230* 268* 164*  BUN _0 CREATININE 0.72 0.72 0.94  CALCIUM 10.0 9.3 9.2   Liver Function Tests: No results for input(s): AST, ALT, ALKPHOS, BILITOT, PROT, ALBUMIN in the last 168 hours. No results for input(s): LIPASE, AMYLASE in the last 168 hours. No results for input(s): AMMONIA in the last 168 hours. CBC: Recent Labs  Lab 05/13/18 1221 05/15/18 0400 05/16/18 0455 05/17/18 0329  WBC 9.1 9.1 9.3 8.3  HGB 14.6 13.2 13.6 13.5  HCT 44.4 38.8 40.1 38.8  MCV 91.0 89.6 89.9 89.2  PLT 419* 395 417* 363   Cardiac Enzymes: Recent Labs  Lab 05/13/18 1656 05/13/18 2158 05/14/18 0414 05/15/18 0813  TROPONINI <0.03 <0.03 <0.03 <0.03   BNP: BNP (last 3 results) No results for input(s): BNP in the last 8760 hours.  ProBNP (last 3 results) No results for input(s): PROBNP in the last 8760 hours.  CBG: Recent Labs  Lab 05/16/18 0550 05/16/18 0848 05/16/18 1703 05/16/18 2134 05/17/18 0748  GLUCAP 232* 206* 154* 200* 157*       Signed:  Fayrene Helper MD.  [facility] 05/18/2018, 12:35 AM

## 2018-05-25 ENCOUNTER — Ambulatory Visit: Payer: BC Managed Care – PPO | Admitting: Cardiology

## 2018-05-25 ENCOUNTER — Other Ambulatory Visit: Payer: Self-pay

## 2018-05-25 VITALS — BP 120/90 | HR 73 | Ht 62.0 in | Wt 161.4 lb

## 2018-05-25 DIAGNOSIS — IMO0001 Reserved for inherently not codable concepts without codable children: Secondary | ICD-10-CM

## 2018-05-25 DIAGNOSIS — I10 Essential (primary) hypertension: Secondary | ICD-10-CM | POA: Diagnosis not present

## 2018-05-25 DIAGNOSIS — E785 Hyperlipidemia, unspecified: Secondary | ICD-10-CM

## 2018-05-25 DIAGNOSIS — Z712 Person consulting for explanation of examination or test findings: Secondary | ICD-10-CM

## 2018-05-25 DIAGNOSIS — E1169 Type 2 diabetes mellitus with other specified complication: Secondary | ICD-10-CM

## 2018-05-25 DIAGNOSIS — Z7189 Other specified counseling: Secondary | ICD-10-CM

## 2018-05-25 DIAGNOSIS — Z9189 Other specified personal risk factors, not elsewhere classified: Secondary | ICD-10-CM

## 2018-05-25 NOTE — Progress Notes (Signed)
Cardiology Office Note:    Date:  05/25/2018   ID:  NILZA EAKER, DOB [birth year], MRN [id number]  PCP:  Lawerance Cruel, MD  Cardiologist:  Buford Dresser, MD PhD  Referring MD: Lawerance Cruel, MD   CC: post hospital follow up  History of Present Illness:    Tara [name] is a 48 y.o. female with a hx of hypertension, type II diabetes, recent admission for chest pain who is seen in follow up today for recent admission.   Cardiac history:  Discharged on 05/17/18 after workup for chest pain. She had negative troponins and a low risk stress test, but there was a small area of reversibility seen on the distal anteroseptal wall suspicious for ischemia. Given that and her risk factors, she underwent cath, which showed normal coronaries. However, on her echo, there was concern for a left atrial mass. She underwent cardiac MRI, which was negative for mass.  Today: feeling fatigued since discharged. Reviewed results of all testing with her today. Reviewed medications at length. Chest discomfort no longer bothersome. Wondering if she can cut back on some of her medications. Discussed diet and exercise recommendations as well as prevention management strategies with her diabetes.  Past Medical History:  Diagnosis Date  . Diabetes mellitus without complication ([place])   . Hypertension     Past Surgical History:  Procedure Laterality Date  . CESAREAN SECTION    . LEFT HEART CATH AND CORONARY ANGIOGRAPHY N/A 05/16/2018   Procedure: LEFT HEART CATH AND CORONARY ANGIOGRAPHY;  Surgeon: Lorretta Harp, MD;  Location: [facility];  Service: Cardiovascular;  Laterality: N/A;    Current Medications: Current Outpatient Medications on File Prior to Visit  Medication Sig  . amLODipine-benazepril (LOTREL) 5-20 MG capsule Take 1 capsule by mouth daily.  Marland Kitchen aspirin EC 81 MG tablet Take 81 mg by mouth once.  Marland Kitchen atorvastatin (LIPITOR) 40 MG tablet Take 1 tablet (40 mg total) by mouth  daily at 6 PM for 30 days.  Marland Kitchen BIOTIN 5000 PO Take 1 tablet by mouth daily.  . blood glucose meter kit and supplies KIT Dispense based on patient and insurance preference. Use up to four times daily as directed. (FOR ICD-9 250.00, 250.01).  . Dulaglutide (TRULICITY) 6.30 ZS/0.1UX SOPN Inject 0.75 mg into the skin once a week for 30 days.  . Magnesium 250 MG TABS Take 250 mg by mouth daily.  . metFORMIN (GLUCOPHAGE-XR) 500 MG 24 hr tablet Take 1 tablet (500 mg total) by mouth 2 (two) times daily for 30 days. (gradually increase as tolerated as instructed in AVS)  . metoprolol tartrate (LOPRESSOR) 25 MG tablet Take 1 tablet (25 mg total) by mouth 2 (two) times daily for 30 days.  . Multiple Vitamins-Minerals (MULTIVITAMIN WITH MINERALS) tablet Take 1 tablet by mouth daily.   No current facility-administered medications on file prior to visit.      Allergies:   Patient has no known allergies.   Social History   Socioeconomic History  . Marital status: Married    Spouse name: Not on file  . Number of children: Not on file  . Years of education: Not on file  . Highest education level: Not on file  Occupational History  . Occupation: Technical sales engineer  Social Needs  . Financial resource strain: Not on file  . Food insecurity:    Worry: Not on file    Inability: Not on file  . Transportation needs:    Medical:  Not on file    Non-medical: Not on file  Tobacco Use  . Smoking status: Never Smoker  . Smokeless tobacco: Never Used  Substance and Sexual Activity  . Alcohol use: Yes    Frequency: Never    Comment: occ.  . Drug use: Never  . Sexual activity: Not on file  Lifestyle  . Physical activity:    Days per week: Not on file    Minutes per session: Not on file  . Stress: Not on file  Relationships  . Social connections:    Talks on phone: Not on file    Gets together: Not on file    Attends religious service: Not on file    Active member of club or organization: Not on  file    Attends meetings of clubs or organizations: Not on file    Relationship status: Not on file  Other Topics Concern  . Not on file  Social History Narrative  . Not on file     Family History: The patient's family history includes Heart failure in her paternal uncle.  ROS:   Please see the history of present illness.  Additional pertinent ROS:  Constitutional: Negative for chills, fever, night sweats, unintentional weight loss  HENT: Negative for ear pain and hearing loss.   Eyes: Negative for loss of vision and eye pain.  Respiratory: Negative for cough, sputum, shortness of breath, wheezing.   Cardiovascular: See HPI. Gastrointestinal: Negative for abdominal pain, melena, and hematochezia.  Genitourinary: Negative for dysuria and hematuria.  Musculoskeletal: Negative for falls and myalgias.  Skin: Negative for itching and rash.  Neurological: Negative for focal weakness, focal sensory changes and loss of consciousness.  Endo/Heme/Allergies: Does not bruise/bleed easily.    EKGs/Labs/Other Studies Reviewed:    The following studies were reviewed today: Echo 05/14/18  1. The left ventricle has normal systolic function with an ejection fraction of 60-65%. The cavity size was normal. There is moderately increased left ventricular wall thickness. Left ventricular diastology could not be evaluated due to nondiagnostic  images. The average left ventricular global longitudinal strain is -19.8 %.  2. The right ventricle has normal systolic function. The cavity was normal. There is no increase in right ventricular wall thickness.  3. The tricuspid valve is normal in structure.  4. The pulmonic valve was normal in structure.  5. The average left ventricular global longitudinal strain is -19.8 %.  6. The mitral valve is normal in structure.  7. The aortic valve is normal in structure.  8. Questionable density in the LA but only seen in the parasternal short axis view. Consider  Cardiac CTA vs. TEE for better elucidation.  MRI 05/17/18 1. Normal left ventricular size with moderate concentric hypertrophy and normal systolic function (LVEF = 67%). There are no regional wall motion abnormalities. There is no late gadolinium enhancement in the left ventricular myocardium. 2. Normal right ventricular size, thickness and systolic function (LVEF = 57%). There are no regional wall motion abnormalities. 3.  Normal left and right atrial size. 4. Normal size of the aortic root, ascending aorta and pulmonary artery. 5. Trivial mitral regurgitation. 6.  Normal pericardium.  No pericardial effusion. There is no evidence for a left atrial mass.  Nuclear study 05/14/18 Perfusion: A small area of reversibility is seen in the distal anteroseptal wall of the left ventricle, suspicious for myocardial ischemia. No other perfusion defects identified. Wall Motion: Normal left ventricular wall motion. No left ventricular dilation. Left Ventricular Ejection Fraction:  52 %  Cath 05/16/18 Normal coronaries, normal filling pressures   EKG:  EKG is personally reviewed.  The ekg ordered today demonstrates normal sinus rhythm Recent Labs: 05/13/2018: TSH 1.349 05/17/2018: BUN 16; Creatinine, Ser 0.94; Hemoglobin 13.5; Platelets 363; Potassium 3.7; Sodium 137  Recent Lipid Panel    Component Value Date/Time   CHOL 291 (H) 05/14/2018 0414   TRIG 54 05/14/2018 0414   HDL 61 05/14/2018 0414   CHOLHDL 4.8 05/14/2018 0414   VLDL 11 05/14/2018 0414   LDLCALC 219 (H) 05/14/2018 0414    Physical Exam:    VS:  BP 120/90   Pulse 73   Ht _0  (1.575 m)   Wt 161 lb 6.4 oz (73.2 kg)   BMI 29.52 kg/m     Wt Readings from Last 3 Encounters:  05/25/18 161 lb 6.4 oz (73.2 kg)  05/17/18 156 lb 12.8 oz (71.1 kg)     GEN: Well nourished, well developed in no acute distress HEENT: Normal NECK: No JVD; No carotid bruits LYMPHATICS: No lymphadenopathy CARDIAC: regular rhythm, normal S1 and  S2, no murmurs, rubs, gallops. Radial and DP pulses 2+ bilaterally. RESPIRATORY:  Clear to auscultation without rales, wheezing or rhonchi  ABDOMEN: Soft, non-tender, non-distended MUSCULOSKELETAL:  No edema; No deformity  SKIN: Warm and dry NEUROLOGIC:  Alert and oriented x 3 PSYCHIATRIC:  Normal affect   ASSESSMENT:    1. Transition of care performed with sharing of clinical summary   2. Essential hypertension   3. Encounter to discuss test results   4. Type 2 diabetes mellitus with hyperlipidemia (HCC)   5. Cardiac risk counseling   6. Counseling on health promotion and disease prevention    PLAN:    Post hospital follow up within 2 weeks: improved symptoms. Reviewed test results at length. She has a stress test, cath, echo, and cardiac MRI (initial tests were equivocal/cannot exclude abnormalities, so required decisive testing). All final testing was normal.  Hypertension in the setting of type II diabetes: goal <130/90, just at goal today -continue amlodipine-benazepril 5-20 mg daily -given her fatigue and lack of CAD on cath, will stop metoprolol. Given instructions on how to taper today. -if her BP rises, would increase her amlodipine-benazepril or consider diuretic prior to adding beta blocker  Hyperlipidemia in the setting of type II diabetes: no evidence of CAD on cath, no history of ASCVD. Aggressive goal would be LDL <100, though at least LDL <130 -on atorvastatin 40 mg, tolerating -LDL was 219 05/14/18 prior to initiation of statin. However, HDL at 61 and TG at 54 were well controlled. Surprisingly, no CAD on cath despite this high LDL level. She plans to follow up with PCP, would recheck lipids and check LFTs at that visit.   Cardiac risk counseling and prevention recommendations: -recommend heart healthy/Mediterranean diet, with whole grains, fruits, vegetable, fish, lean meats, nuts, and olive oil. Limit salt. -recommend moderate walking, 3-5 times/week for 30-50  minutes each session. Aim for at least 150 minutes.week. Goal should be pace of 3 miles/hours, or walking 1.5 miles in 30 minutes -recommend avoidance of tobacco products. Avoid excess alcohol. -Additional risk factor control:  -Diabetes: A1c is 9.7, on metformin and DPP4  -Lipids: as above  -Blood pressure control: as above  -Weight: BMI 29, discussed lifestyle  -given her diabetes and LDL, she is taking aspirin 81 mg daily for prevention. Ok to continue, she is having no side effects at this time. -ASCVD risk score: The 10-year ASCVD  risk score Mikey Bussing DC Jr., et al., 2013) is: 10.4%   Values used to calculate the score:     Age: 17 years     Sex: Female     Is Non-Hispanic African American: Yes     Diabetic: Yes     Tobacco smoker: No     Systolic Blood Pressure: 478 mmHg     Is BP treated: Yes     HDL Cholesterol: 61 mg/dL     Total Cholesterol: 291 mg/dL    Plan for follow up: 1 year or sooner PRN.  Medication Adjustments/Labs and Tests Ordered: Current medicines are reviewed at length with the patient today.  Concerns regarding medicines are outlined above.  Orders Placed This Encounter  Procedures  . EKG 12-Lead   No orders of the defined types were placed in this encounter.   Patient Instructions  Medication Instructions:  STOP Metoprolol   If you need a refill on your cardiac medications before your next appointment, please call your pharmacy.   Follow-Up: At [facility], you and your health needs are our priority.  As part of our continuing mission to provide you with exceptional heart care, we have created designated Provider Care Teams.  These Care Teams include your primary Cardiologist (physician) and Advanced Practice Providers (APPs -  Physician Assistants and Nurse Practitioners) who all work together to provide you with the care you need, when you need it. You will need a follow up appointment in 1 year.  Please call our office 2 months in advance to  schedule this appointment.  You may see Dr. Harrell Gave or one of the following Advanced Practice Providers on your designated Care Team:   Rosaria Ferries, PA-C . Jory Sims, DNP, ANP  Any Other Special Instructions Will Be Listed Below (If Applicable). None      Signed, Buford Dresser, MD PhD 05/25/2018 8:19 AM    [place]

## 2018-05-25 NOTE — Patient Instructions (Signed)
Medication Instructions:  STOP Metoprolol   If you need a refill on your cardiac medications before your next appointment, please call your pharmacy.   Follow-Up: At [facility], you and your health needs are our priority.  As part of our continuing mission to provide you with exceptional heart care, we have created designated Provider Care Teams.  These Care Teams include your primary Cardiologist (physician) and Advanced Practice Providers (APPs -  Physician Assistants and Nurse Practitioners) who all work together to provide you with the care you need, when you need it. You will need a follow up appointment in 1 year.  Please call our office 2 months in advance to schedule this appointment.  You may see Dr. Harrell Gave or one of the following Advanced Practice Providers on your designated Care Team:   Rosaria Ferries, PA-C . Jory Sims, DNP, ANP  Any Other Special Instructions Will Be Listed Below (If Applicable). None

## 2018-05-30 ENCOUNTER — Encounter: Payer: Self-pay | Admitting: Cardiology

## 2018-05-31 ENCOUNTER — Ambulatory Visit: Payer: BC Managed Care – PPO | Admitting: Registered"

## 2019-06-01 ENCOUNTER — Ambulatory Visit: Payer: BC Managed Care – PPO | Admitting: Cardiology

## 2019-07-12 ENCOUNTER — Ambulatory Visit: Payer: BC Managed Care – PPO | Admitting: Cardiology

## 2019-07-12 ENCOUNTER — Encounter: Payer: Self-pay | Admitting: Cardiology

## 2019-07-12 ENCOUNTER — Other Ambulatory Visit: Payer: Self-pay

## 2019-07-12 VITALS — BP 146/94 | HR 91 | Temp 97.2°F | Ht 62.0 in | Wt 164.0 lb

## 2019-07-12 DIAGNOSIS — Z7189 Other specified counseling: Secondary | ICD-10-CM | POA: Diagnosis not present

## 2019-07-12 DIAGNOSIS — I10 Essential (primary) hypertension: Secondary | ICD-10-CM | POA: Diagnosis not present

## 2019-07-12 DIAGNOSIS — R072 Precordial pain: Secondary | ICD-10-CM | POA: Diagnosis not present

## 2019-07-12 DIAGNOSIS — E1169 Type 2 diabetes mellitus with other specified complication: Secondary | ICD-10-CM | POA: Diagnosis not present

## 2019-07-12 DIAGNOSIS — E785 Hyperlipidemia, unspecified: Secondary | ICD-10-CM

## 2019-07-12 NOTE — Progress Notes (Signed)
Cardiology Office Note:    Date:  07/12/2019   ID:  Tara [name], DOB 08-02-1970, MRN [id number]  PCP:  Lawerance Cruel, MD  Cardiologist:  Buford Dresser, MD PhD  Referring MD: Lawerance Cruel, MD   CC: follow up  History of Present Illness:    Tara [name] is a 49 y.o. female with a hx of hypertension, type II diabetes, recent admission for chest pain who is seen in follow up..   Cardiac history:  Discharged on 05/17/18 after workup for chest pain. She had negative troponins and a low risk stress test, but there was a small area of reversibility seen on the distal anteroseptal wall suspicious for ischemia. Given that and her risk factors, she underwent cath, which showed normal coronaries. However, on her echo, there was concern for a left atrial mass. She underwent cardiac MRI, which was negative for mass.  Today: Overall doing well. Rare sensation of chest discomfort, random, mild. Wondering if it is indigestion, but no clear relationship.   If walking daily, taking medication, etc her BP is well controlled. When she started back to walking, her BP was actually on the low side. Hasn't taken yet this AM. Was taking amlodipine 78m-benazepril 20 mg two caplets twice a day, for a total of 20 mg amlodipine and 80 mg of benazepril daily. Discussed that ideally, typical max doses of these are 10 mg amlodipine and 40 mg benazepril. She is now taking 2 tabs once a day, which is at this more typical dose. She has been on HCTZ in the past and done well, but BP is now well controlled on this regimen. Has some variable ranges, but hovers around 130s/80s.  Has follow up visit coming up with Dr. RHarrington Challenger thinks she will get labs at that time.   Denies shortness of breath at rest or with normal exertion. No PND, orthopnea, LE edema or unexpected weight gain. No syncope or palpitations.  Past Medical History:  Diagnosis Date  . Diabetes mellitus without complication ([place]   .  Hypertension     Past Surgical History:  Procedure Laterality Date  . CESAREAN SECTION    . LEFT HEART CATH AND CORONARY ANGIOGRAPHY N/A 05/16/2018   Procedure: LEFT HEART CATH AND CORONARY ANGIOGRAPHY;  Surgeon: BLorretta Harp MD;  [facility];  Service: Cardiovascular;  Laterality: N/A;    Current Medications: Current Outpatient Medications on File Prior to Visit  Medication Sig  . amLODipine-benazepril (LOTREL) 5-20 MG capsule Take 2 capsules by mouth daily.   .Marland KitchenBIOTIN 5000 PO Take 1 tablet by mouth daily.  . blood glucose meter kit and supplies KIT Dispense based on patient and insurance preference. Use up to four times daily as directed. (FOR ICD-9 250.00, 250.01).  . metFORMIN (GLUCOPHAGE-XR) 500 MG 24 hr tablet Take 1 tablet (500 mg total) by mouth 2 (two) times daily for 30 days. (gradually increase as tolerated as instructed in AVS)  . Multiple Vitamins-Minerals (MULTIVITAMIN WITH MINERALS) tablet Take 1 tablet by mouth daily.  .Marland Kitchenaspirin EC 81 MG tablet Take 81 mg by mouth once.  .Marland Kitchenatorvastatin (LIPITOR) 40 MG tablet Take 1 tablet (40 mg total) by mouth daily at 6 PM for 30 days.   No current facility-administered medications on file prior to visit.     Allergies:   Patient has no known allergies.   Social History   Tobacco Use  . Smoking status: Never Smoker  . Smokeless tobacco: Never  Used  Substance Use Topics  . Alcohol use: Yes    Comment: occ.  . Drug use: Never    Family History: The patient's family history includes Heart failure in her paternal uncle.  ROS:   Please see the history of present illness.  Additional pertinent ROS otherwise unremarkable.  EKGs/Labs/Other Studies Reviewed:    The following studies were reviewed today: Echo 05/14/18  1. The left ventricle has normal systolic function with an ejection fraction of 60-65%. The cavity size was normal. There is moderately increased left ventricular wall thickness. Left  ventricular diastology could not be evaluated due to nondiagnostic  images. The average left ventricular global longitudinal strain is -19.8 %.  2. The right ventricle has normal systolic function. The cavity was normal. There is no increase in right ventricular wall thickness.  3. The tricuspid valve is normal in structure.  4. The pulmonic valve was normal in structure.  5. The average left ventricular global longitudinal strain is -19.8 %.  6. The mitral valve is normal in structure.  7. The aortic valve is normal in structure.  8. Questionable density in the LA but only seen in the parasternal short axis view. Consider Cardiac CTA vs. TEE for better elucidation.  MRI 05/17/18 1. Normal left ventricular size with moderate concentric hypertrophy and normal systolic function (LVEF = 67%). There are no regional wall motion abnormalities. There is no late gadolinium enhancement in the left ventricular myocardium. 2. Normal right ventricular size, thickness and systolic function (LVEF = 57%). There are no regional wall motion abnormalities. 3.  Normal left and right atrial size. 4. Normal size of the aortic root, ascending aorta and pulmonary artery. 5. Trivial mitral regurgitation. 6.  Normal pericardium.  No pericardial effusion. There is no evidence for a left atrial mass.  Nuclear study 05/14/18 Perfusion: A small area of reversibility is seen in the distal anteroseptal wall of the left ventricle, suspicious for myocardial ischemia. No other perfusion defects identified. Wall Motion: Normal left ventricular wall motion. No left ventricular dilation. Left Ventricular Ejection Fraction: 52 %  Cath 05/16/18 Normal coronaries, normal filling pressures   EKG:  EKG is personally reviewed.  The ekg ordered today demonstrates normal sinus rhythm, borderline LVH, nonspecific T wave pattern in I, II, aVL, V5, V6  Recent Labs: No results found for requested labs within last 8760 hours.    Recent Lipid Panel    Component Value Date/Time   CHOL 291 (H) 05/14/2018 0414   TRIG 54 05/14/2018 0414   HDL 61 05/14/2018 0414   CHOLHDL 4.8 05/14/2018 0414   VLDL 11 05/14/2018 0414   LDLCALC 219 (H) 05/14/2018 0414    Physical Exam:    VS:  BP (!) 146/94 (BP Location: Left Arm, Patient Position: Sitting, Cuff Size: Normal)   Pulse 91   Temp (!) 97.2 F (36.2 C)   Ht 5' 2" (1.575 m)   Wt 164 lb (74.4 kg)   BMI 30.00 kg/m     Wt Readings from Last 3 Encounters:  07/12/19 164 lb (74.4 kg)  05/25/18 161 lb 6.4 oz (73.2 kg)  05/17/18 156 lb 12.8 oz (71.1 kg)    GEN: Well nourished, well developed in no acute distress HEENT: Normal, moist mucous membranes NECK: No JVD CARDIAC: regular rhythm, normal S1 and S2, no rubs or gallops. No murmur. VASCULAR: Radial and DP pulses 2+ bilaterally. No carotid bruits RESPIRATORY:  Clear to auscultation without rales, wheezing or rhonchi  ABDOMEN: Soft, non-tender,  non-distended MUSCULOSKELETAL:  Ambulates independently SKIN: Warm and dry, no edema NEUROLOGIC:  Alert and oriented x 3. No focal neuro deficits noted. PSYCHIATRIC:  Normal affect   ASSESSMENT:    1. Essential hypertension   2. Type 2 diabetes mellitus with hyperlipidemia (HCC)   3. Cardiac risk counseling   4. Counseling on health promotion and disease prevention   5. Precordial pain    PLAN:   Chest pain: rare, atypical. Normal cath during recent hospitalization -unlikely to be ischemia -may ne non-cardiac cause vs. Microvascular ischemia -risk factor control, as below  Hypertension in the setting of type II diabetes: goal <130/90, elevated today -reports good control at home and occasional lows -continue amlodipine-benazepril 5-20 mg daily (she takes two tabs, total of 10-40 mg daily) -avoid beta blocker given fatigue, previously tapered off of metoprolol -continue to chart home BP, bring to upcoming visit with Dr. Harrington Challenger -Consider chlorthalidone in  additional BP control needed  Hyperlipidemia in the setting of type II diabetes: no evidence of CAD on cath, no history of ASCVD. Aggressive goal would be LDL <100, though at least LDL <130 -on atorvastatin 40 mg, tolerating -LDL was 219 05/14/18 prior to initiation of statin. However, HDL at 61 and TG at 54 were well controlled. Surprisingly, no CAD on cath despite this high LDL level.  -She plans to follow up with PCP, would recheck lipids and check LFTs at that visit.   Cardiac risk counseling and prevention recommendations: -recommend heart healthy/Mediterranean diet, with whole grains, fruits, vegetable, fish, lean meats, nuts, and olive oil. Limit salt. -recommend moderate walking, 3-5 times/week for 30-50 minutes each session. Aim for at least 150 minutes.week. Goal should be pace of 3 miles/hours, or walking 1.5 miles in 30 minutes -recommend avoidance of tobacco products. Avoid excess alcohol. -Additional risk factor control:  -Diabetes: A1c is 5.8, on metformin (possibly on GLP1RA per [facility], unclear if still taking)  -Lipids: as above  -Blood pressure control: as above  -Weight: BMI 30, discussed lifestyle  -given her diabetes and LDL, she is taking aspirin 81 mg daily for prevention. Ok to continue, she is having no side effects at this time. -ASCVD risk score: The 10-year ASCVD risk score [name], et al., 2013) is: 14.3%   Values used to calculate the score:     Age: 89 years     Sex: Female     Is Non-Hispanic African American: Yes     Diabetic: Yes     Tobacco smoker: No     Systolic Blood Pressure: 481 mmHg     Is BP treated: Yes     HDL Cholesterol: 61 mg/dL     Total Cholesterol: 291 mg/dL    Plan for follow up: 2 years or sooner PRN.  Medication Adjustments/Labs and Tests Ordered: Current medicines are reviewed at length with the patient today.  Concerns regarding medicines are outlined above.  Orders Placed This Encounter  Procedures  . EKG 12-Lead   No  orders of the defined types were placed in this encounter.   Patient Instructions  Medication Instructions:  Your Physician recommend you continue on your current medication as directed.    *If you need a refill on your cardiac medications before your next appointment, please call your pharmacy*   Lab Work: None   Testing/Procedures: None   Follow-Up: At [facility], you and your health needs are our priority.  As part of our continuing mission to provide you with exceptional heart care,  we have created designated Provider Care Teams.  These Care Teams include your primary Cardiologist (physician) and Advanced Practice Providers (APPs -  Physician Assistants and Nurse Practitioners) who all work together to provide you with the care you need, when you need it.  We recommend signing up for the patient portal called "MyChart".  Sign up information is provided on this After Visit Summary.  MyChart is used to connect with patients for Virtual Visits (Telemedicine).  Patients are able to view lab/test results, encounter notes, upcoming appointments, etc.  Non-urgent messages can be sent to your provider as well.   To learn more about what you can do with MyChart, go to [email or web address].    Your next appointment:   2 year(s)  The format for your next appointment:   In Person  Provider:   Buford Dresser, MD        Signed, Buford Dresser, MD PhD 07/12/2019 2:13 PM    [place]

## 2019-07-12 NOTE — Patient Instructions (Signed)
Medication Instructions:  Your Physician recommend you continue on your current medication as directed.    *If you need a refill on your cardiac medications before your next appointment, please call your pharmacy*   Lab Work: None   Testing/Procedures: None   Follow-Up: At [facility], you and your health needs are our priority.  As part of our continuing mission to provide you with exceptional heart care, we have created designated Provider Care Teams.  These Care Teams include your primary Cardiologist (physician) and Advanced Practice Providers (APPs -  Physician Assistants and Nurse Practitioners) who all work together to provide you with the care you need, when you need it.  We recommend signing up for the patient portal called "MyChart".  Sign up information is provided on this After Visit Summary.  MyChart is used to connect with patients for Virtual Visits (Telemedicine).  Patients are able to view lab/test results, encounter notes, upcoming appointments, etc.  Non-urgent messages can be sent to your provider as well.   To learn more about what you can do with MyChart, go to [email or web address].    Your next appointment:   2 year(s)  The format for your next appointment:   In Person  Provider:   Bridgette Christopher, MD     

## 2019-08-20 ENCOUNTER — Encounter: Payer: Self-pay | Admitting: Cardiology

## 2019-08-22 ENCOUNTER — Other Ambulatory Visit: Payer: Self-pay

## 2019-08-22 ENCOUNTER — Ambulatory Visit (HOSPITAL_COMMUNITY)
Admission: EM | Admit: 2019-08-22 | Discharge: 2019-08-22 | Disposition: A | Discharge status: Home / Self Care | Payer: BC Managed Care – PPO | Attending: Physician Assistant | Admitting: Physician Assistant

## 2019-08-22 DIAGNOSIS — M545 Low back pain, unspecified: Secondary | ICD-10-CM

## 2019-08-22 LAB — POCT URINALYSIS DIP (DEVICE)
Bilirubin Urine: NEGATIVE
Glucose, UA: 500 mg/dL — AB
Hgb urine dipstick: NEGATIVE
Leukocytes,Ua: NEGATIVE
Nitrite: NEGATIVE
Protein, ur: NEGATIVE mg/dL
Specific Gravity, Urine: 1.025 (ref 1.005–1.030)
Urobilinogen, UA: 0.2 mg/dL (ref 0.0–1.0)
pH: 5.5 (ref 5.0–8.0)

## 2019-08-22 LAB — CBG MONITORING, ED: Glucose-Capillary: 229 mg/dL — ABNORMAL HIGH (ref 70–99)

## 2019-08-22 MED ORDER — TIZANIDINE HCL 4 MG PO TABS
4.0000 mg | ORAL_TABLET | Freq: Three times a day (TID) | ORAL | 0 refills | Status: DC | PRN
Start: 2019-08-22 — End: 2021-08-06

## 2019-08-22 NOTE — ED Triage Notes (Signed)
Pt presents today with left sided flank pain that radiates to left abdomen. Pt states pain is constant, never sharp. Pt states pain was being relieved by tylenol/motrin rotation but now has break through pain that is keeping her from sleeping. Pt denise unsure of urinary symptoms. Pt denies hx of kidney stones.

## 2019-08-22 NOTE — ED Provider Notes (Signed)
[facility]    CSN: [id number] Arrival date & time: 08/22/19  2951      History   Chief Complaint Chief Complaint  Patient presents with  . Back Pain    HPI Tara [name] is a 49 y.o. female.   Patient with history of diabetes and hypertension presents for left-sided back and side pain.  She reports pain has been present for about a week.  Reports initially was responding well to Tylenol and Aleve however since then has been giving her more trouble.  She reports pain is primary in the left lower back is occasionally felt some pain around her side and in her belly at times.  She describes the pain as an achy pain.  Up to a 6 out of 10.  She reports can be worse with laying down.  She does report a little bit of sensation down her left buttocks and the right leg occasionally.  Other movements do not seem to make this worse.  She reports last night this made it uncomfortable to try to sleep.  There is been no numbness or tingling.  No weakness.  She does report occasional increased urgency to urinate however denies painful urination or frequency urination.  She has not noticed any blood in her urine.  She has been moving her bowels that are normal rate of at least once a day.  Denies hard stool or difficulty passing stool.  No blood or dark stool.  She has not had any fevers or chills.  She does report she has been quite active over the last week and under a lot of stress as there is been to recent deaths in the family.  She reports she believes this may have been distracting her from pain for a while however now that things have calmed down she has been noticing it more.   She denies history of back pain or back injury.  She reports she is perimenopausal and that she could not be pregnant right now  She reports she has a follow-up with her primary care next week on Wednesday.     Past Medical History:  Diagnosis Date  . Diabetes mellitus without complication ([place])   .  Hypertension     Patient Active Problem List   Diagnosis Date Noted  . Type 2 diabetes mellitus with hyperlipidemia ([place]) 05/14/2018  . Chest pain 05/13/2018  . Hypertension   . Diabetes mellitus without complication [facility])     Past Surgical History:  Procedure Laterality Date  . CESAREAN SECTION    . LEFT HEART CATH AND CORONARY ANGIOGRAPHY N/A 05/16/2018   Procedure: LEFT HEART CATH AND CORONARY ANGIOGRAPHY;  Surgeon: Lorretta Harp, MD;  Location: [place] [facility];  Service: Cardiovascular;  Laterality: N/A;    OB History   No obstetric history on file.      Home Medications    Prior to Admission medications   Medication Sig Start Date End Date Taking? Authorizing Provider  amLODipine-benazepril (LOTREL) 5-20 MG capsule Take 2 capsules by mouth daily.  04/13/18  Yes [provider]  aspirin EC 81 MG tablet Take 81 mg by mouth once.   Yes [provider]  BIOTIN 5000 PO Take 1 tablet by mouth daily.   Yes [provider]  blood glucose meter kit and supplies KIT Dispense based on patient and insurance preference. Use up to four times daily as directed. (FOR ICD-9 250.00, 250.01). 05/17/18  Yes Elodia Florence.,  MD  metFORMIN (GLUCOPHAGE-XR) 500 MG 24 hr tablet Take 1 tablet (500 mg total) by mouth 2 (two) times daily for 30 days. (gradually increase as tolerated as instructed in AVS) 05/17/18 08/22/19 Yes Elodia Florence., MD  Multiple Vitamins-Minerals (MULTIVITAMIN WITH MINERALS) tablet Take 1 tablet by mouth daily.   Yes [provider]  atorvastatin (LIPITOR) 40 MG tablet Take 1 tablet (40 mg total) by mouth daily at 6 PM for 30 days. 05/17/18 06/16/18  Elodia Florence., MD  tiZANidine (ZANAFLEX) 4 MG tablet Take 1 tablet (4 mg total) by mouth every 8 (eight) hours as needed for muscle spasms. 08/22/19   Dandra Velardi, Marguerita Beards, PA-C    Family History Family History  Problem Relation Age of Onset  . Heart failure Paternal Uncle      Social History Social History   Tobacco Use  . Smoking status: Never Smoker  . Smokeless tobacco: Never Used  Substance Use Topics  . Alcohol use: Yes    Comment: occ.  . Drug use: Never     Allergies   Patient has no known allergies.   Review of Systems Review of Systems   Physical Exam Triage Vital Signs ED Triage Vitals  Enc Vitals Group     BP      Pulse      Resp      Temp      Temp src      SpO2      Weight      Height      Head Circumference      Peak Flow      Pain Score      Pain Loc      Pain Edu?      Excl. in [place]?    No data found.  Updated Vital Signs BP (!) 164/104 (BP Location: Right Arm) Comment: pt states she missed her am amilodipine  Pulse 93   Temp 98.6 F (37 C) (Oral)   Resp 18   LMP  (LMP Unknown) Comment: IUD, and premenapausal   SpO2 100%   Visual Acuity Right Eye Distance:   Left Eye Distance:   Bilateral Distance:    Right Eye Near:   Left Eye Near:    Bilateral Near:     Physical Exam Vitals and nursing note reviewed.  Constitutional:      General: She is not in acute distress.    Appearance: Normal appearance. She is well-developed. She is not ill-appearing.  HENT:     Head: Normocephalic and atraumatic.  Eyes:     Conjunctiva/sclera: Conjunctivae normal.  Cardiovascular:     Rate and Rhythm: Normal rate and regular rhythm.     Heart sounds: No murmur.  Pulmonary:     Effort: Pulmonary effort is normal. No respiratory distress.     Breath sounds: Normal breath sounds. No wheezing, rhonchi or rales.  Abdominal:     General: Abdomen is flat. Bowel sounds are normal.     Palpations: Abdomen is soft. There is no mass.     Tenderness: There is no abdominal tenderness. There is no right CVA tenderness, left CVA tenderness, guarding or rebound.     Comments: Patient denies tenderness only pressure in the left flank and lower abdomen.  Musculoskeletal:     Cervical back: Neck supple.     Right lower leg: No  edema.     Left lower leg: No edema.     Comments: There is  some stiffness and spasm in the left lumbar paraspinal.  Mild tenderness coming around to the lateral aspect of the musculature.  Patient denies any tenderness with palpation of the lumbar paraspinals.  No midline tenderness.  Straight leg raise is negative.  No pain elicited with spinal flexion or extension.  Skin:    General: Skin is warm and dry.  Neurological:     General: No focal deficit present.     Mental Status: She is alert and oriented to person, place, and time.     Comments: Patient is ambulatory without issue.  Normal gait.      UC Treatments / Results  Labs (all labs ordered are listed, but only abnormal results are displayed) Labs Reviewed  POCT URINALYSIS DIP (DEVICE) - Abnormal; Notable for the following components:      Result Value   Glucose, UA 500 (*)    Ketones, ur TRACE (*)    All other components within normal limits  CBG MONITORING, ED - Abnormal; Notable for the following components:   Glucose-Capillary 229 (*)    All other components within normal limits    EKG   Radiology No results found.  Procedures Procedures (including critical care time)  Medications Ordered in UC Medications - No data to display  Initial Impression / Assessment and Plan / UC Course  I have reviewed the triage vital signs and the nursing notes.  Pertinent labs & imaging results that were available during my care of the patient were reviewed by me and considered in my medical decision making (see chart for details).     #Acute left-sided low back pain Patient is a 49 year old with history of diabetes and hypertension presenting with acute low back pain.  UA without evidence of infection or stone at this time.  BGL slightly elevated, patient states she has her Trulicity injection tomorrow.  She does report she has had some change in her diet recently due to stress.  Most likely this is mechanical back pain we  will treat with muscle relaxer and have her have close follow-up with her primary care next week.  Strict return emergency department precautions were discussed.  Patient verbalized understanding.  Final Clinical Impressions(s) / UC Diagnoses   Final diagnoses:  Acute left-sided low back pain without sciatica     Discharge Instructions     Take the muscle relaxer as needed every 8 hours, pefered at night. It will make you sleepy, so do not drive , drink alcohol or operate machinery within 8 hours of taking  Continue tylenol and aleeve at home  Follow up with your PCP next week  If acutely worsening or severe abdominal pain, report to the ED      ED Prescriptions    Medication Sig Dispense Auth. Provider   tiZANidine (ZANAFLEX) 4 MG tablet Take 1 tablet (4 mg total) by mouth every 8 (eight) hours as needed for muscle spasms. 15 tablet Dilynn Munroe, Marguerita Beards, PA-C     PDMP not reviewed this encounter.   Purnell Shoemaker, PA-C 08/22/19 1144

## 2019-08-22 NOTE — Discharge Instructions (Addendum)
Take the muscle relaxer as needed every 8 hours, pefered at night. It will make you sleepy, so do not drive , drink alcohol or operate machinery within 8 hours of taking  Continue tylenol and aleeve at home  Follow up with your PCP next week  If acutely worsening or severe abdominal pain, report to the ED

## 2021-08-06 ENCOUNTER — Ambulatory Visit (INDEPENDENT_AMBULATORY_CARE_PROVIDER_SITE_OTHER): Payer: BC Managed Care – PPO | Admitting: Cardiology

## 2021-08-06 ENCOUNTER — Encounter (HOSPITAL_BASED_OUTPATIENT_CLINIC_OR_DEPARTMENT_OTHER): Payer: Self-pay | Admitting: Cardiology

## 2021-08-06 VITALS — BP 142/90 | HR 85 | Ht 62.0 in | Wt 149.0 lb

## 2021-08-06 DIAGNOSIS — E1169 Type 2 diabetes mellitus with other specified complication: Secondary | ICD-10-CM | POA: Diagnosis not present

## 2021-08-06 DIAGNOSIS — I10 Essential (primary) hypertension: Secondary | ICD-10-CM | POA: Diagnosis not present

## 2021-08-06 DIAGNOSIS — E785 Hyperlipidemia, unspecified: Secondary | ICD-10-CM

## 2021-08-06 DIAGNOSIS — Z7189 Other specified counseling: Secondary | ICD-10-CM

## 2021-08-06 DIAGNOSIS — E119 Type 2 diabetes mellitus without complications: Secondary | ICD-10-CM | POA: Diagnosis not present

## 2021-08-06 NOTE — Patient Instructions (Signed)

## 2021-08-06 NOTE — Progress Notes (Signed)
Cardiology Office Note:    Date:  08/06/2021   ID:  Tara [name], DOB 1970-03-30, MRN [id number]  PCP:  Lawerance Cruel, MD  Cardiologist:  Buford Dresser, MD PhD  Referring MD: Lawerance Cruel, MD   CC: follow up  History of Present Illness:    Tara [name] is a 51 y.o. female with a hx of hypertension, type II diabetes, recent admission for chest pain who is seen in follow up..   Cardiac history:  Discharged on 05/17/18 after workup for chest pain. She had negative troponins and a low risk stress test, but there was a small area of reversibility seen on the distal anteroseptal wall suspicious for ischemia. Given that and her risk factors, she underwent cath, which showed normal coronaries. However, on her echo, there was concern for a left atrial mass. She underwent cardiac MRI, which was negative for mass.  Today: Has had a lot of stress, just lost her brother. Has noticed intermittent tachycardia, but has been dealing with a lot of anxiety. Had one episode of chest pressure the night after he passed, no other episodes. Talking to her counselor twice a week, has a lot of family for support. Trying to walk, take time for herself. Tearful in the office today.  Hasn't taken medication yet today. Had to drive to [place] and back this AM to get her brother's cell phone.   Tolerating medications well.  Denies shortness of breath at rest or with normal exertion. No PND, orthopnea, LE edema or unexpected weight gain. No syncope.  Past Medical History:  Diagnosis Date   Diabetes mellitus without complication ([place])    Hypertension     Past Surgical History:  Procedure Laterality Date   CESAREAN SECTION     LEFT HEART CATH AND CORONARY ANGIOGRAPHY N/A 05/16/2018   Procedure: LEFT HEART CATH AND CORONARY ANGIOGRAPHY;  Surgeon: Lorretta Harp, MD;  Location: [place] [facility];  Service: Cardiovascular;  Laterality: N/A;    Current Medications: Current Outpatient  Medications on File Prior to Visit  Medication Sig   amLODipine-benazepril (LOTREL) 10-40 MG capsule Take 1 capsule by mouth daily.   aspirin EC 81 MG tablet Take 81 mg by mouth once.   atorvastatin (LIPITOR) 40 MG tablet once a week.   BIOTIN 5000 PO Take 1 tablet by mouth daily.   blood glucose meter kit and supplies KIT Dispense based on patient and insurance preference. Use up to four times daily as directed. (FOR ICD-9 250.00, 250.01).   JARDIANCE 25 MG TABS tablet Take 25 mg by mouth daily.   levonorgestrel (MIRENA, 52 MG,) 20 MCG/DAY IUD Mirena 21 mcg/24 hours (8 yrs) 52 mg intrauterine device  PROVIDED BY CARE CENTER   Multiple Vitamins-Minerals (MULTIVITAMIN WITH MINERALS) tablet Take 1 tablet by mouth daily.   TRULICITY 5.45 GY/5.6LS SOPN Inject 0.75 mg into the skin once a week.   metFORMIN (GLUCOPHAGE-XR) 500 MG 24 hr tablet Take 1 tablet (500 mg total) by mouth 2 (two) times daily for 30 days. (gradually increase as tolerated as instructed in AVS)   No current facility-administered medications on file prior to visit.     Allergies:   Patient has no known allergies.   Social History   Tobacco Use   Smoking status: Never   Smokeless tobacco: Never  Substance Use Topics   Alcohol use: Yes    Comment: occ.   Drug use: Never    Family History: The patient's family history includes  Heart failure in her paternal uncle.  ROS:   Please see the history of present illness.  Additional pertinent ROS otherwise unremarkable.  EKGs/Labs/Other Studies Reviewed:    The following studies were reviewed today: Echo 05/14/18  1. The left ventricle has normal systolic function with an ejection fraction of 60-65%. The cavity size was normal. There is moderately increased left ventricular wall thickness. Left ventricular diastology could not be evaluated due to nondiagnostic  images. The average left ventricular global longitudinal strain is -19.8 %.  2. The right ventricle has normal  systolic function. The cavity was normal. There is no increase in right ventricular wall thickness.  3. The tricuspid valve is normal in structure.  4. The pulmonic valve was normal in structure.  5. The average left ventricular global longitudinal strain is -19.8 %.  6. The mitral valve is normal in structure.  7. The aortic valve is normal in structure.  8. Questionable density in the LA but only seen in the parasternal short axis view. Consider Cardiac CTA vs. TEE for better elucidation.  MRI 05/17/18 1. Normal left ventricular size with moderate concentric hypertrophy and normal systolic function (LVEF = 67%). There are no regional wall motion abnormalities.  There is no late gadolinium enhancement in the left ventricular myocardium.  2. Normal right ventricular size, thickness and systolic function (LVEF = 57%). There are no regional wall motion abnormalities.  3.  Normal left and right atrial size.  4. Normal size of the aortic root, ascending aorta and pulmonary artery.  5. Trivial mitral regurgitation.  6.  Normal pericardium.  No pericardial effusion.  There is no evidence for a left atrial mass.  Nuclear study 05/14/18 Perfusion: A small area of reversibility is seen in the distal anteroseptal wall of the left ventricle, suspicious for myocardial ischemia. No other perfusion defects identified.  Wall Motion: Normal left ventricular wall motion. No left ventricular dilation.  Left Ventricular Ejection Fraction: 52 %  Cath 05/16/18 Normal coronaries, normal filling pressures  EKG:  EKG is personally reviewed.   08/06/21: NSR at 85 bpm, nonspecific T wave pattern unchanged.  Recent Labs: No results found for requested labs within last 8760 hours.  Recent Lipid Panel    Component Value Date/Time   CHOL 291 (H) 05/14/2018 0414   TRIG 54 05/14/2018 0414   HDL 61 05/14/2018 0414   CHOLHDL 4.8 05/14/2018 0414   VLDL 11 05/14/2018 0414   LDLCALC 219 (H) 05/14/2018 0414     Physical Exam:    VS:  BP (!) 142/90   Pulse 85   Ht _0  (1.575 m)   Wt 149 lb (67.6 kg)   SpO2 97%   BMI 27.25 kg/m     Wt Readings from Last 3 Encounters:  08/06/21 149 lb (67.6 kg)  07/12/19 164 lb (74.4 kg)  05/25/18 161 lb 6.4 oz (73.2 kg)    GEN: Well nourished, well developed in no acute distress HEENT: Normal, moist mucous membranes NECK: No JVD CARDIAC: regular rhythm, normal S1 and S2, no rubs or gallops. No murmur. VASCULAR: Radial and DP pulses 2+ bilaterally. No carotid bruits RESPIRATORY:  Clear to auscultation without rales, wheezing or rhonchi  ABDOMEN: Soft, non-tender, non-distended MUSCULOSKELETAL:  Ambulates independently SKIN: Warm and dry, no edema NEUROLOGIC:  Alert and oriented x 3. No focal neuro deficits noted. PSYCHIATRIC:  Normal affect    ASSESSMENT:    1. Primary hypertension   2. Type 2 diabetes mellitus with hyperlipidemia (HCC)  3. Diabetes mellitus without complication (HCC)   4. Cardiac risk counseling   5. Counseling on health promotion and disease prevention     PLAN:   Chest pain: rare, atypical. Normal cath previously -unlikely to be ischemia -may be non-cardiac cause -risk factor control, as below  Hypertension in the setting of type II diabetes: goal <130/90, elevated today -has not taken meds yet today, has been under significant life stress with loss of her brother -continue amlodipine-benazepril 10-40 mg daily -avoid beta blocker given fatigue, previously tapered off of metoprolol -Consider chlorthalidone in additional BP control needed  Hyperlipidemia in the setting of type II diabetes: no evidence of CAD on cath, no history of ASCVD. Aggressive goal would be LDL <100, though at least LDL <130 -on atorvastatin 40 mg, tolerating -LDL was 216 02/21/20 -she has upcoming follow up with Dr. Harrington Challenger, can recheck lipids at that time -Surprisingly, no CAD on cath despite this high LDL level.   On both [place]  for type II diabetes. On aspirin and statin.  Cardiac risk counseling and prevention recommendations: -recommend heart healthy/Mediterranean diet, with whole grains, fruits, vegetable, fish, lean meats, nuts, and olive oil. Limit salt. -recommend moderate walking, 3-5 times/week for 30-50 minutes each session. Aim for at least 150 minutes.week. Goal should be pace of 3 miles/hours, or walking 1.5 miles in 30 minutes -recommend avoidance of tobacco products. Avoid excess alcohol.  Plan for follow up: 2 years or sooner PRN.  Medication Adjustments/Labs and Tests Ordered: Current medicines are reviewed at length with the patient today.  Concerns regarding medicines are outlined above.  Orders Placed This Encounter  Procedures   EKG 12-Lead   No orders of the defined types were placed in this encounter.   Patient Instructions  Medication Instructions:  Your Physician recommend you continue on your current medication as directed.    *If you need a refill on your cardiac medications before your next appointment, please call your pharmacy*   Lab Work: None ordered today   Testing/Procedures: None ordered today   Follow-Up: At [facility], you and your health needs are our priority.  As part of our continuing mission to provide you with exceptional heart care, we have created designated Provider Care Teams.  These Care Teams include your primary Cardiologist (physician) and Advanced Practice Providers (APPs -  Physician Assistants and Nurse Practitioners) who all work together to provide you with the care you need, when you need it.  We recommend signing up for the patient portal called "MyChart".  Sign up information is provided on this After Visit Summary.  MyChart is used to connect with patients for Virtual Visits (Telemedicine).  Patients are able to view lab/test results, encounter notes, upcoming appointments, etc.  Non-urgent messages can be sent to your provider as well.   To  learn more about what you can do with MyChart, go to [email or web address].    Your next appointment:   2 year(s)  The format for your next appointment:   In Person  Provider:   Buford Dresser, MD{          Signed, Buford Dresser, MD PhD 08/06/2021 5:44 PM    [place]

## 2021-12-12 ENCOUNTER — Encounter: Payer: Self-pay | Admitting: Gastroenterology

## 2021-12-12 ENCOUNTER — Ambulatory Visit (AMBULATORY_SURGERY_CENTER): Payer: Self-pay | Admitting: *Deleted

## 2021-12-12 VITALS — Ht 62.0 in | Wt 147.2 lb

## 2021-12-12 DIAGNOSIS — Z1211 Encounter for screening for malignant neoplasm of colon: Secondary | ICD-10-CM

## 2021-12-12 MED ORDER — NA SULFATE-K SULFATE-MG SULF 17.5-3.13-1.6 GM/177ML PO SOLN: 1.0000 | Freq: Once | ORAL | 0 refills | Status: AC

## 2021-12-12 NOTE — Progress Notes (Signed)
No egg or soy allergy known to patient  No issues known to pt with past sedation with any surgeries or procedures Patient denies being told to intubate No FH of Malignant Hyperthermia Pt is not on diet pills Pt is not on  home 02  Pt is not on blood thinners  Pt denies issues with constipation  Pt encouraged to use to use Singlecare or Goodrx to reduce cost

## 2021-12-15 ENCOUNTER — Encounter: Payer: Self-pay | Admitting: Gastroenterology

## 2021-12-15 ENCOUNTER — Ambulatory Visit (AMBULATORY_SURGERY_CENTER): Payer: BC Managed Care – PPO | Admitting: Gastroenterology

## 2021-12-15 VITALS — BP 138/82 | HR 79 | Temp 97.5°F | Resp 14 | Ht 62.0 in | Wt 147.2 lb

## 2021-12-15 DIAGNOSIS — Z1211 Encounter for screening for malignant neoplasm of colon: Secondary | ICD-10-CM

## 2021-12-15 DIAGNOSIS — D122 Benign neoplasm of ascending colon: Secondary | ICD-10-CM | POA: Diagnosis not present

## 2021-12-15 DIAGNOSIS — D123 Benign neoplasm of transverse colon: Secondary | ICD-10-CM | POA: Diagnosis not present

## 2021-12-15 MED ORDER — SODIUM CHLORIDE 0.9 % IV SOLN: 500.0000 mL | INTRAVENOUS | Status: DC

## 2021-12-15 NOTE — Patient Instructions (Signed)

## 2021-12-15 NOTE — Progress Notes (Signed)
Pt's states no medical or surgical changes since previsit or office visit. 

## 2021-12-15 NOTE — Op Note (Signed)
[place] Patient Name: Tara [name] Procedure Date: 12/15/2021 11:50 AM MRN: [id number] Endoscopist: Mauri Pole , MD Age: 51 Referring MD:  Date of Birth: [birth year] Gender: Female Account #: [id number] Procedure:                Colonoscopy Indications:              Screening for colorectal malignant neoplasm Medicines:                Monitored Anesthesia Care Procedure:                Pre-Anesthesia Assessment:                           - Prior to the procedure, a History and Physical                            was performed, and patient medications and                            allergies were reviewed. The patient's tolerance of                            previous anesthesia was also reviewed. The risks                            and benefits of the procedure and the sedation                            options and risks were discussed with the patient.                            All questions were answered, and informed consent                            was obtained. Prior Anticoagulants: The patient has                            taken no previous anticoagulant or antiplatelet                            agents. ASA Grade Assessment: II - A patient with                            mild systemic disease. After reviewing the risks                            and benefits, the patient was deemed in                            satisfactory condition to undergo the procedure.                           After obtaining informed consent, the colonoscope  was passed under direct vision. Throughout the                            procedure, the patient's blood pressure, pulse, and                            oxygen saturations were monitored continuously. The                            Olympus PCF-[id number]) Colonoscope was                            introduced through the anus and advanced to the the                            cecum,  identified by appendiceal orifice and                            ileocecal valve. The colonoscopy was performed                            without difficulty. The patient tolerated the                            procedure well. The quality of the bowel                            preparation was good. The ileocecal valve,                            appendiceal orifice, and rectum were photographed. Scope In: 11:58:11 AM Scope Out: 12:16:53 PM Scope Withdrawal Time: 0 hours 11 minutes 59 seconds  Total Procedure Duration: 0 hours 18 minutes 42 seconds  Findings:                 The perianal and digital rectal examinations were                            normal.                           A 1 mm polyp was found in the transverse colon. The                            polyp was sessile. The polyp was removed with a                            cold biopsy forceps. Resection and retrieval were                            complete.                           Two sessile polyps were found in the transverse  colon and ascending colon. The polyps were 4 to 6                            mm in size. These polyps were removed with a cold                            snare. Resection and retrieval were complete.                           Non-bleeding external and internal hemorrhoids were                            found during retroflexion. The hemorrhoids were                            small. Complications:            No immediate complications. Estimated Blood Loss:     Estimated blood loss was minimal. Impression:               - One 1 mm polyp in the transverse colon, removed                            with a cold biopsy forceps. Resected and retrieved.                           - Two 4 to 6 mm polyps in the transverse colon and                            in the ascending colon, removed with a cold snare.                            Resected and retrieved.                            - Non-bleeding external and internal hemorrhoids. Recommendation:           - Patient has a contact number available for                            emergencies. The signs and symptoms of potential                            delayed complications were discussed with the                            patient. Return to normal activities tomorrow.                            Written discharge instructions were provided to the                            patient.                           -  Resume previous diet.                           - Continue present medications.                           - Await pathology results.                           - Repeat colonoscopy in 3 - 5 years for                            surveillance based on pathology results. Mauri Pole, MD 12/15/2021 12:26:09 PM This report has been signed electronically.

## 2021-12-15 NOTE — Progress Notes (Signed)
[facility] History and Physical   Primary Care Physician:  Dian Queen, MD   Reason for Procedure:  Colorectal cancer screening  Plan:    Screening colonoscopy with possible interventions as needed     HPI: Tara [name] is a very pleasant 51 y.o. female here for screening colonoscopy. Denies any nausea, vomiting, abdominal pain, melena or bright red blood per rectum  The risks and benefits as well as alternatives of endoscopic procedure(s) have been discussed and reviewed. All questions answered. The patient agrees to proceed.    Past Medical History:  Diagnosis Date   Allergy    Anxiety    Depression    Diabetes mellitus without complication (HCC)    Heart murmur    Hypertension    Palpitations    in 2020- sees Dr. Harrell Gave yearly- started after possibly having covid, no issues since    Past Surgical History:  Procedure Laterality Date   CESAREAN SECTION     x2   LEFT HEART CATH AND CORONARY ANGIOGRAPHY N/A 05/16/2018   Procedure: LEFT HEART CATH AND CORONARY ANGIOGRAPHY;  Surgeon: Lorretta Harp, MD;  Location: [facility];  Service: Cardiovascular;  Laterality: N/A;   TONSILLECTOMY      Prior to Admission medications   Medication Sig Start Date End Date Taking? Authorizing Provider  amLODipine-benazepril (LOTREL) 10-40 MG capsule Take 1 capsule by mouth daily.   Yes [provider]  BIOTIN 5000 PO Take 1 tablet by mouth daily.   Yes [provider]  blood glucose meter kit and supplies KIT Dispense based on patient and insurance preference. Use up to four times daily as directed. (FOR ICD-9 250.00, 250.01). 05/17/18  Yes Elodia Florence., MD  JARDIANCE 25 MG TABS tablet Take 25 mg by mouth daily. 05/22/21  Yes [provider]  levonorgestrel (MIRENA, 52 MG,) 20 MCG/DAY IUD Mirena 21 mcg/24 hours (8 yrs) 52 mg intrauterine device  [place]   Yes [provider]  metFORMIN  (GLUCOPHAGE-XR) 500 MG 24 hr tablet daily.   Yes [provider]  Multiple Vitamins-Minerals (MULTIVITAMIN WITH MINERALS) tablet Take 1 tablet by mouth daily.   Yes [provider]  Psyllium (METAMUCIL PO) Take by mouth daily at 2 am.   Yes [provider]  aspirin EC 81 MG tablet Take 81 mg by mouth once.    [provider]  atorvastatin (LIPITOR) 40 MG tablet once a week.    [provider]  metFORMIN (GLUCOPHAGE-XR) 500 MG 24 hr tablet Take 1 tablet (500 mg total) by mouth 2 (two) times daily for 30 days. (gradually increase as tolerated as instructed in AVS) Patient not taking: Reported on 12/15/2021 05/17/18 08/22/19  Elodia Florence., MD  TRULICITY 0.99 IP/3.8SN SOPN Inject 0.75 mg into the skin once a week. 05/22/21   [provider]    Current Outpatient Medications  Medication Sig Dispense Refill   amLODipine-benazepril (LOTREL) 10-40 MG capsule Take 1 capsule by mouth daily.     BIOTIN 5000 PO Take 1 tablet by mouth daily.     blood glucose meter kit and supplies KIT Dispense based on patient and insurance preference. Use up to four times daily as directed. (FOR ICD-9 250.00, 250.01). 1 each 0   JARDIANCE 25 MG TABS tablet Take 25 mg by mouth daily.     levonorgestrel (MIRENA, 52 MG,) 20 MCG/DAY IUD Mirena 21 mcg/24 hours (8 yrs) 52 mg intrauterine device  PROVIDED BY CARE CENTER     metFORMIN (GLUCOPHAGE-XR) 500 MG 24 hr tablet daily.     Multiple Vitamins-Minerals (MULTIVITAMIN WITH MINERALS) tablet Take 1 tablet by mouth daily.     Psyllium (METAMUCIL PO) Take by mouth daily at 2 am.     aspirin EC 81 MG tablet Take 81 mg by mouth once.     atorvastatin (LIPITOR) 40 MG tablet once a week.     metFORMIN (GLUCOPHAGE-XR) 500 MG 24 hr tablet Take 1 tablet (500 mg total) by mouth 2 (two) times daily for 30 days. (gradually increase as tolerated as instructed in AVS) (Patient not taking: Reported on 12/15/2021) 60 tablet 0   TRULICITY  0.22 VV/6.1QA SOPN Inject 0.75 mg into the skin once a week.     Current Facility-Administered Medications  Medication Dose Route Frequency Provider Last Rate Last Admin   0.9 %  sodium chloride infusion  500 mL Intravenous Continuous Joan Avetisyan, Venia Minks, MD        Allergies as of 12/15/2021   (No Known Allergies)    Family History  Problem Relation Age of Onset   Heart failure Paternal Uncle    Colon cancer Neg Hx    Esophageal cancer Neg Hx    Rectal cancer Neg Hx    Stomach cancer Neg Hx        a paternal aunt possibly had stomach CA    Social History   Socioeconomic History   Marital status: Married    Spouse name: Not on file   Number of children: Not on file   Years of education: Not on file   Highest education level: Not on file  Occupational History   Occupation: Technical sales engineer  Tobacco Use   Smoking status: Never   Smokeless tobacco: Never  Vaping Use   Vaping Use: Never used  Substance and Sexual Activity   Alcohol use: Yes    Comment: occ.   Drug use: Never   Sexual activity: Yes    Birth control/protection: I.U.D.  Other Topics Concern   Not on file  Social History Narrative   Not on file   Social Determinants of Health   Financial Resource Strain: Not on file  Food Insecurity: Not on file  Transportation Needs: Not on file  Physical Activity: Not on file  Stress: Not on file  Social Connections: Not on file  Intimate Partner Violence: Not on file    Review of Systems:  All other review of systems negative except as mentioned in the HPI.  Physical Exam: Vital signs in last 24 hours: Blood Pressure 124/89   Pulse 88   Temperature (Abnormal) 97.5 F (36.4 C) (Temporal)   Height _0  (1.575 m)   Weight 147 lb 3.2 oz (66.8 kg)   Oxygen Saturation 98%   Body Mass Index 26.92 kg/m  General:   Alert, NAD Lungs:  Clear .   Heart:  Regular rate and rhythm Abdomen:  Soft, nontender and nondistended. Neuro/Psych:  Alert and  cooperative. Normal mood and affect. A and O x 3  Reviewed labs, radiology imaging, old records and pertinent past GI work up  Patient is appropriate for planned procedure(s) and anesthesia in an ambulatory setting   K. Denzil Magnuson , MD [phone]

## 2021-12-15 NOTE — Progress Notes (Signed)
Called to room to assist during endoscopic procedure.  Patient ID and intended procedure confirmed with present staff. Received instructions for my participation in the procedure from the performing physician.  

## 2021-12-15 NOTE — Progress Notes (Signed)
Pt resting comfortably. VSS. Airway intact. SBAR complete to RN. All questions answered.   

## 2021-12-16 ENCOUNTER — Telehealth: Payer: Self-pay

## 2021-12-16 NOTE — Telephone Encounter (Signed)
Attempted to reach patient for post-procedure f/u call. No answer. Left message for her to please not hesitate to call us if she has any questions/concerns regarding her care. 

## 2021-12-25 ENCOUNTER — Encounter: Payer: Self-pay | Admitting: Gastroenterology

## 2022-01-02 ENCOUNTER — Encounter: Payer: BC Managed Care – PPO | Admitting: Internal Medicine

## 2022-11-09 ENCOUNTER — Telehealth (HOSPITAL_BASED_OUTPATIENT_CLINIC_OR_DEPARTMENT_OTHER): Payer: Self-pay | Admitting: Cardiology

## 2022-11-09 NOTE — Telephone Encounter (Signed)
Left message for patient to call back  

## 2022-11-09 NOTE — Telephone Encounter (Signed)
Patient stated her PCP wants her to have have a CT chest test.  Patient scheduled to see Dr. Cristal Deer on 9/24.

## 2022-11-09 NOTE — Telephone Encounter (Signed)
Patient scheduled to see Dr Cristal Deer 9/24  Unsure what kind of CT PCP was recommending, she will discuss with Dr Cristal Deer at visit

## 2022-12-08 ENCOUNTER — Ambulatory Visit (HOSPITAL_BASED_OUTPATIENT_CLINIC_OR_DEPARTMENT_OTHER): Payer: BC Managed Care – PPO | Admitting: Cardiology

## 2023-03-23 ENCOUNTER — Ambulatory Visit (HOSPITAL_BASED_OUTPATIENT_CLINIC_OR_DEPARTMENT_OTHER): Payer: BC Managed Care – PPO | Admitting: Cardiology

## 2023-05-25 ENCOUNTER — Ambulatory Visit (HOSPITAL_BASED_OUTPATIENT_CLINIC_OR_DEPARTMENT_OTHER): Payer: BC Managed Care – PPO | Admitting: Cardiology
